# Patient Record
Sex: Male | Born: 1980 | Race: Black or African American | Hispanic: No | Marital: Single | Smoking: Current every day smoker
Health system: Southern US, Community
[De-identification: ages and names within clinical notes are randomized; demographics above are authoritative.]

## PROBLEM LIST (undated history)

## (undated) DIAGNOSIS — Z789 Other specified health status: Secondary | ICD-10-CM

## (undated) DIAGNOSIS — F419 Anxiety disorder, unspecified: Secondary | ICD-10-CM

## (undated) DIAGNOSIS — Z5189 Encounter for other specified aftercare: Secondary | ICD-10-CM

## (undated) DIAGNOSIS — L309 Dermatitis, unspecified: Secondary | ICD-10-CM

---
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
## 2002-06-10 ENCOUNTER — Encounter: Admission: RE | Admit: 2002-06-10 | Discharge: 2002-06-10 | Payer: Self-pay | Admitting: Family Medicine

## 2003-02-25 ENCOUNTER — Emergency Department (HOSPITAL_COMMUNITY): Admission: EM | Admit: 2003-02-25 | Discharge: 2003-02-25 | Payer: Self-pay | Admitting: Emergency Medicine

## 2006-03-06 ENCOUNTER — Ambulatory Visit: Payer: Self-pay | Admitting: *Deleted

## 2006-03-06 ENCOUNTER — Inpatient Hospital Stay (HOSPITAL_COMMUNITY): Admission: AD | Admit: 2006-03-06 | Discharge: 2006-03-10 | Payer: Self-pay | Admitting: *Deleted

## 2006-11-11 ENCOUNTER — Emergency Department (HOSPITAL_COMMUNITY): Admission: EM | Admit: 2006-11-11 | Discharge: 2006-11-12 | Payer: Self-pay | Admitting: Emergency Medicine

## 2007-12-06 ENCOUNTER — Emergency Department (HOSPITAL_COMMUNITY): Admission: EM | Admit: 2007-12-06 | Discharge: 2007-12-06 | Payer: Self-pay | Admitting: Emergency Medicine

## 2008-03-27 ENCOUNTER — Emergency Department (HOSPITAL_COMMUNITY): Admission: EM | Admit: 2008-03-27 | Discharge: 2008-03-27 | Payer: Self-pay | Admitting: Emergency Medicine

## 2008-07-17 ENCOUNTER — Emergency Department (HOSPITAL_COMMUNITY): Admission: EM | Admit: 2008-07-17 | Discharge: 2008-07-17 | Payer: Self-pay | Admitting: Emergency Medicine

## 2009-01-14 ENCOUNTER — Emergency Department (HOSPITAL_COMMUNITY): Admission: EM | Admit: 2009-01-14 | Discharge: 2009-01-14 | Payer: Self-pay | Admitting: Emergency Medicine

## 2009-05-02 ENCOUNTER — Emergency Department (HOSPITAL_COMMUNITY): Admission: EM | Admit: 2009-05-02 | Discharge: 2009-05-03 | Payer: Self-pay | Admitting: Emergency Medicine

## 2009-08-04 ENCOUNTER — Emergency Department (HOSPITAL_COMMUNITY): Admission: EM | Admit: 2009-08-04 | Discharge: 2009-08-04 | Payer: Self-pay | Admitting: Emergency Medicine

## 2010-04-12 ENCOUNTER — Emergency Department (HOSPITAL_COMMUNITY): Admission: EM | Admit: 2010-04-12 | Discharge: 2010-04-12 | Payer: Self-pay | Admitting: Emergency Medicine

## 2010-05-04 ENCOUNTER — Inpatient Hospital Stay (HOSPITAL_COMMUNITY): Admission: EM | Admit: 2010-05-04 | Discharge: 2010-05-10 | Payer: Self-pay | Admitting: Emergency Medicine

## 2010-05-06 ENCOUNTER — Encounter (INDEPENDENT_AMBULATORY_CARE_PROVIDER_SITE_OTHER): Payer: Self-pay | Admitting: Internal Medicine

## 2010-05-10 ENCOUNTER — Ambulatory Visit: Payer: Self-pay | Admitting: Psychiatry

## 2010-05-10 ENCOUNTER — Inpatient Hospital Stay (HOSPITAL_COMMUNITY): Admission: AD | Admit: 2010-05-10 | Discharge: 2010-05-11 | Payer: Self-pay | Admitting: Psychiatry

## 2010-07-26 ENCOUNTER — Ambulatory Visit: Payer: Self-pay | Admitting: Psychiatry

## 2010-09-02 ENCOUNTER — Emergency Department (HOSPITAL_COMMUNITY): Admission: EM | Admit: 2010-09-02 | Discharge: 2009-11-23 | Payer: Self-pay | Admitting: Internal Medicine

## 2010-12-10 LAB — COMPREHENSIVE METABOLIC PANEL
ALT: 33 U/L (ref 0–53)
AST: 27 U/L (ref 0–37)
Albumin: 3.3 g/dL — ABNORMAL LOW (ref 3.5–5.2)
Alkaline Phosphatase: 61 U/L (ref 39–117)
Alkaline Phosphatase: 62 U/L (ref 39–117)
Alkaline Phosphatase: 69 U/L (ref 39–117)
BUN: 9 mg/dL (ref 6–23)
CO2: 24 mEq/L (ref 19–32)
CO2: 24 mEq/L (ref 19–32)
Calcium: 8.2 mg/dL — ABNORMAL LOW (ref 8.4–10.5)
Calcium: 8.4 mg/dL (ref 8.4–10.5)
Calcium: 8.9 mg/dL (ref 8.4–10.5)
Chloride: 113 mEq/L — ABNORMAL HIGH (ref 96–112)
Creatinine, Ser: 0.91 mg/dL (ref 0.4–1.5)
Creatinine, Ser: 0.92 mg/dL (ref 0.4–1.5)
GFR calc non Af Amer: >60 mL/min (ref >60)
Glucose, Bld: 107 mg/dL — ABNORMAL HIGH (ref 70–99)
Potassium: 3.2 mEq/L — ABNORMAL LOW (ref 3.5–5.1)
Potassium: 3.7 mEq/L (ref 3.5–5.1)
Total Bilirubin: 0.9 mg/dL (ref 0.3–1.2)
Total Protein: 6 g/dL (ref 6.0–8.3)
Total Protein: 7.1 g/dL (ref 6.0–8.3)

## 2010-12-10 LAB — CBC
HCT: 38 % — ABNORMAL LOW (ref 39.0–52.0)
MCH: 23.3 pg — ABNORMAL LOW (ref 26.0–34.0)
MCH: 24 pg — ABNORMAL LOW (ref 26.0–34.0)
MCHC: 31.9 g/dL (ref 30.0–36.0)
MCHC: 32.2 g/dL (ref 30.0–36.0)
MCHC: 33 g/dL (ref 30.0–36.0)
MCV: 72.6 fL — ABNORMAL LOW (ref 78.0–100.0)
MCV: 72.7 fL — ABNORMAL LOW (ref 78.0–100.0)
MCV: 73.6 fL — ABNORMAL LOW (ref 78.0–100.0)
Platelets: 162 10*3/uL (ref 150–400)
Platelets: 175 10*3/uL (ref 150–400)
RBC: 5.05 MIL/uL (ref 4.22–5.81)
RBC: 5.17 MIL/uL (ref 4.22–5.81)
RBC: 5.34 MIL/uL (ref 4.22–5.81)
RDW: 14.1 % (ref 11.5–15.5)
RDW: 15.3 % (ref 11.5–15.5)
WBC: 4.4 10*3/uL (ref 4.0–10.5)
WBC: 4.6 10*3/uL (ref 4.0–10.5)
WBC: 4.7 10*3/uL (ref 4.0–10.5)

## 2010-12-10 LAB — RAPID URINE DRUG SCREEN, HOSP PERFORMED
Amphetamines: NOT DETECTED
Barbiturates: NOT DETECTED
Cocaine: NOT DETECTED

## 2010-12-10 LAB — DIFFERENTIAL
Eosinophils Relative: 0 % (ref 0–5)
Lymphocytes Relative: 32 % (ref 12–46)
Lymphs Abs: 1.5 10*3/uL (ref 0.7–4.0)
Monocytes Absolute: 0.4 10*3/uL (ref 0.1–1.0)
Monocytes Relative: 9 % (ref 3–12)
Neutrophils Relative %: 59 % (ref 43–77)

## 2010-12-10 LAB — BASIC METABOLIC PANEL
BUN: 8 mg/dL (ref 6–23)
Chloride: 110 mEq/L (ref 96–112)
Glucose, Bld: 96 mg/dL (ref 70–99)
Potassium: 3.7 mEq/L (ref 3.5–5.1)

## 2010-12-10 LAB — CARDIAC PANEL(CRET KIN+CKTOT+MB+TROPI)
CK, MB: 0.6 ng/mL (ref 0.3–4.0)
CK, MB: 0.7 ng/mL (ref 0.3–4.0)
CK, MB: 1.1 ng/mL (ref 0.3–4.0)
CK, MB: 1.1 ng/mL (ref 0.3–4.0)
Relative Index: 0.6 (ref 0.0–2.5)
Relative Index: 0.8 (ref 0.0–2.5)
Relative Index: 0.8 (ref 0.0–2.5)
Relative Index: 0.8 (ref 0.0–2.5)
Relative Index: INVALID (ref 0.0–2.5)
Total CK: 135 U/L (ref 7–232)
Total CK: 137 U/L (ref 7–232)
Total CK: 166 U/L (ref 7–232)
Troponin I: 0.01 ng/mL (ref 0.00–0.06)
Troponin I: 0.01 ng/mL (ref 0.00–0.06)
Troponin I: 0.01 ng/mL (ref 0.00–0.06)
Troponin I: 0.01 ng/mL (ref 0.00–0.06)
Troponin I: <0.01 ng/mL (ref 0.00–0.06)
Troponin I: <0.01 ng/mL (ref 0.00–0.06)

## 2010-12-10 LAB — GLUCOSE, CAPILLARY
Glucose-Capillary: 101 mg/dL — ABNORMAL HIGH (ref 70–99)
Glucose-Capillary: 103 mg/dL — ABNORMAL HIGH (ref 70–99)
Glucose-Capillary: 104 mg/dL — ABNORMAL HIGH (ref 70–99)
Glucose-Capillary: 115 mg/dL — ABNORMAL HIGH (ref 70–99)
Glucose-Capillary: 117 mg/dL — ABNORMAL HIGH (ref 70–99)
Glucose-Capillary: 74 mg/dL (ref 70–99)
Glucose-Capillary: 99 mg/dL (ref 70–99)

## 2010-12-10 LAB — PHOSPHORUS: Phosphorus: 4.4 mg/dL (ref 2.3–4.6)

## 2010-12-10 LAB — CK: Total CK: 211 U/L (ref 7–232)

## 2010-12-10 LAB — AMMONIA: Ammonia: 15 umol/L (ref 11–35)

## 2010-12-10 LAB — TSH: TSH: 1.123 u[IU]/mL (ref 0.350–4.500)

## 2010-12-10 LAB — ETHANOL: Alcohol, Ethyl (B): <5 mg/dL (ref 0–10)

## 2010-12-10 LAB — MAGNESIUM: Magnesium: 1.9 mg/dL (ref 1.5–2.5)

## 2010-12-29 LAB — ETHANOL: Alcohol, Ethyl (B): <5 mg/dL (ref 0–10)

## 2010-12-29 LAB — HEPATIC FUNCTION PANEL
ALT: 25 U/L (ref 0–53)
AST: 27 U/L (ref 0–37)
Albumin: 4.2 g/dL (ref 3.5–5.2)
Alkaline Phosphatase: 67 U/L (ref 39–117)
Total Protein: 7.6 g/dL (ref 6.0–8.3)

## 2010-12-29 LAB — CBC
Hemoglobin: 12.6 g/dL — ABNORMAL LOW (ref 13.0–17.0)
RBC: 5.3 MIL/uL (ref 4.22–5.81)

## 2010-12-29 LAB — POCT I-STAT, CHEM 8
HCT: 43 % (ref 39.0–52.0)
Hemoglobin: 14.6 g/dL (ref 13.0–17.0)
Potassium: 4.1 mEq/L (ref 3.5–5.1)
Sodium: 139 mEq/L (ref 135–145)

## 2010-12-29 LAB — ACETAMINOPHEN LEVEL: Acetaminophen (Tylenol), Serum: <10 ug/mL — ABNORMAL LOW (ref 10–30)

## 2010-12-29 LAB — RAPID URINE DRUG SCREEN, HOSP PERFORMED
Amphetamines: NOT DETECTED
Benzodiazepines: NOT DETECTED

## 2011-01-01 LAB — DIFFERENTIAL
Basophils Absolute: 0 10*3/uL (ref 0.0–0.1)
Lymphocytes Relative: 35 % (ref 12–46)
Monocytes Absolute: 0.6 10*3/uL (ref 0.1–1.0)
Monocytes Relative: 15 % — ABNORMAL HIGH (ref 3–12)
Neutro Abs: 1.9 10*3/uL (ref 1.7–7.7)

## 2011-01-01 LAB — URINALYSIS, ROUTINE W REFLEX MICROSCOPIC
Glucose, UA: NEGATIVE mg/dL
Ketones, ur: NEGATIVE mg/dL
Specific Gravity, Urine: 1.027 (ref 1.005–1.030)
pH: 6 (ref 5.0–8.0)

## 2011-01-01 LAB — POCT I-STAT, CHEM 8
BUN: 13 mg/dL (ref 6–23)
Calcium, Ion: 1.16 mmol/L (ref 1.12–1.32)
Chloride: 102 mEq/L (ref 96–112)
Potassium: 4 mEq/L (ref 3.5–5.1)

## 2011-01-01 LAB — CBC
Hemoglobin: 13.1 g/dL (ref 13.0–17.0)
RBC: 5.55 MIL/uL (ref 4.22–5.81)
RDW: 14.4 % (ref 11.5–15.5)

## 2011-01-05 LAB — POCT I-STAT, CHEM 8
BUN: 9 mg/dL (ref 6–23)
Chloride: 104 mEq/L (ref 96–112)
HCT: 44 % (ref 39.0–52.0)
Potassium: 4 mEq/L (ref 3.5–5.1)
Sodium: 140 mEq/L (ref 135–145)
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
## 2011-06-11 IMAGING — CR DG CHEST 1V PORT
1 series · 1 of 1 positions shown · non-contrast
Comparison: 01/14/2009.

CLINICAL DATA: Mid chest pain for 2 months.

PORTABLE CHEST - 1 VIEW

[series [date]]
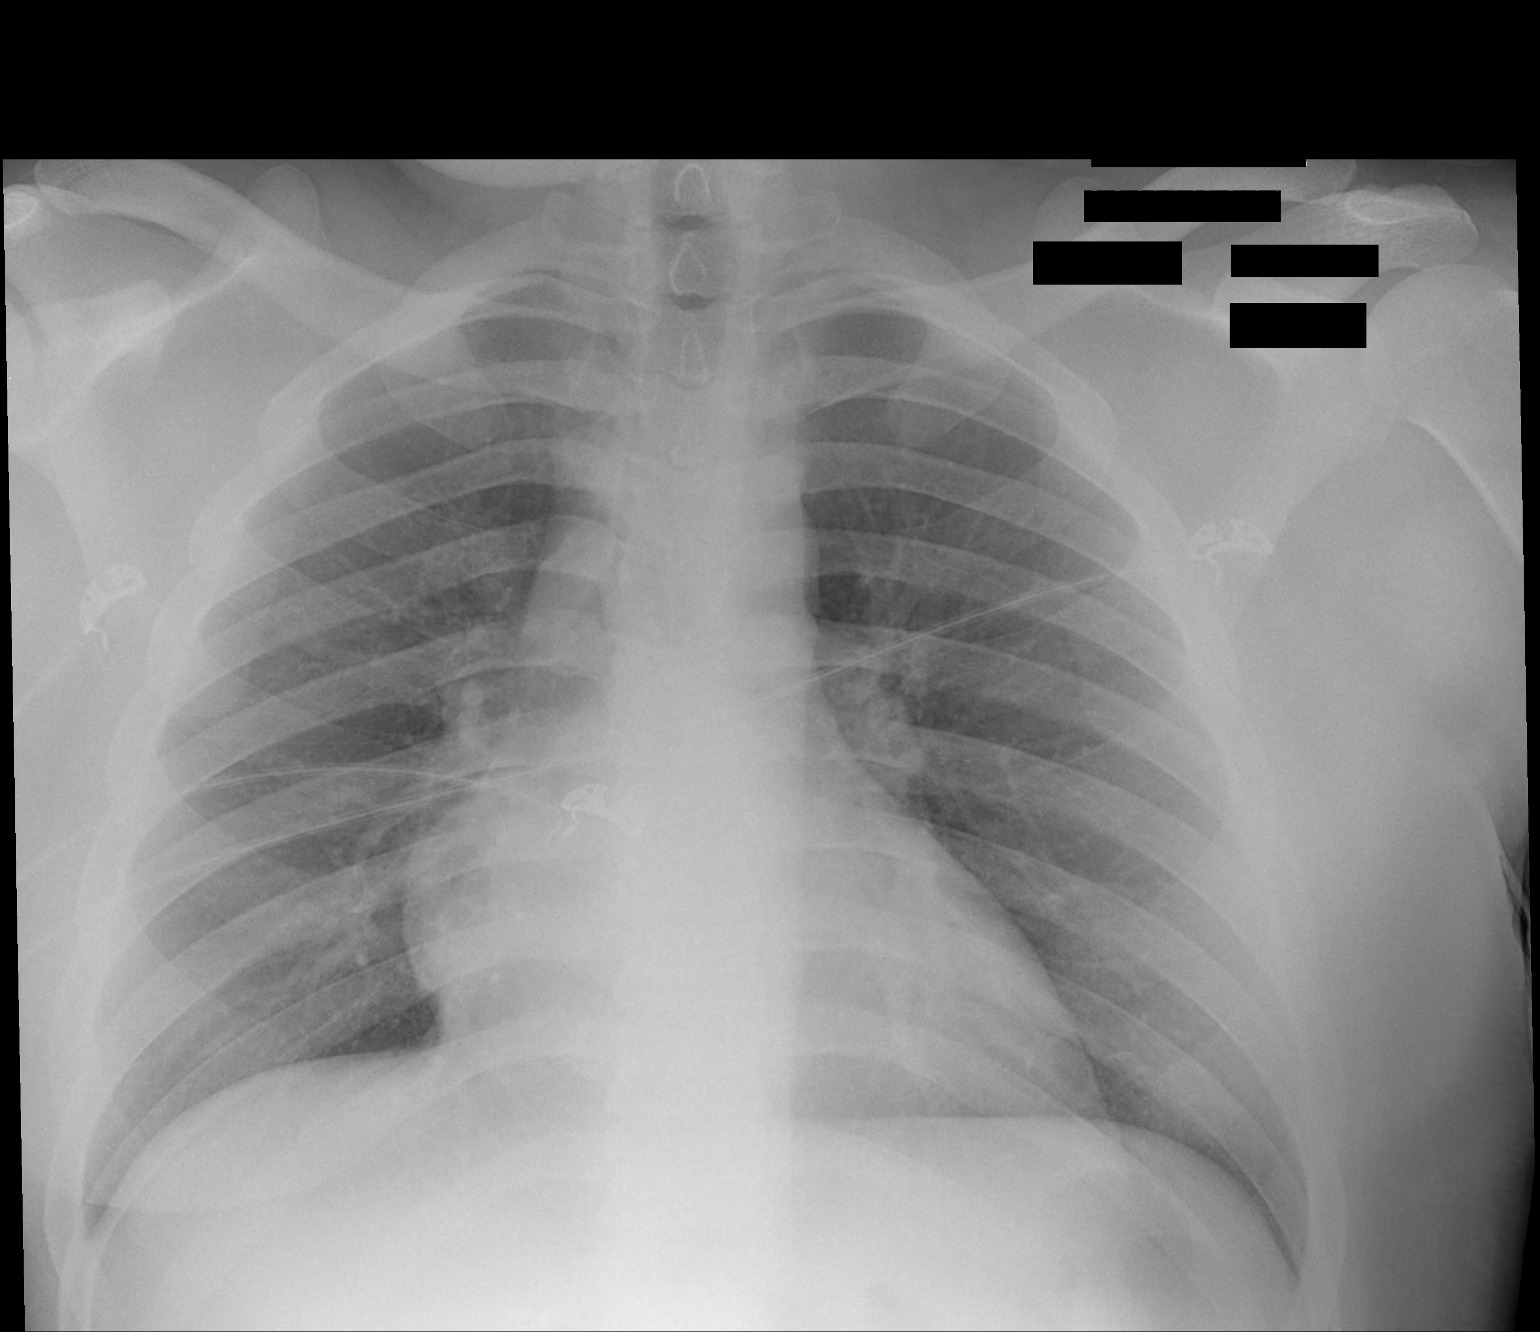

[1 of 1 positions shown; findings below may reference images not displayed]

FINDINGS: Heart size top normal. No infiltrate, congestive heart
failure or pneumothorax.  Minimal scoliosis thoracic spine.
IMPRESSION: Heart size top normal.

No infiltrate, congestive heart failure or pneumothorax.

This is a call report.

## 2011-06-27 LAB — CBC
HCT: 41.8
Hemoglobin: 13
MCV: 73.3 — ABNORMAL LOW
RBC: 5.7
WBC: 4.6

## 2011-06-27 LAB — DIFFERENTIAL
Eosinophils Absolute: 0
Eosinophils Relative: 1
Lymphocytes Relative: 45
Lymphs Abs: 2.1
Monocytes Relative: 9

## 2012-08-17 ENCOUNTER — Emergency Department (HOSPITAL_COMMUNITY): Payer: No Typology Code available for payment source

## 2012-08-17 ENCOUNTER — Encounter (HOSPITAL_COMMUNITY): Payer: Self-pay

## 2012-08-17 ENCOUNTER — Emergency Department (HOSPITAL_COMMUNITY)
Admission: EM | Admit: 2012-08-17 | Discharge: 2012-08-17 | Disposition: A | Discharge status: Home / Self Care | Payer: No Typology Code available for payment source | Attending: Emergency Medicine | Admitting: Emergency Medicine

## 2012-08-17 DIAGNOSIS — M542 Cervicalgia: Secondary | ICD-10-CM

## 2012-08-17 DIAGNOSIS — M545 Low back pain: Secondary | ICD-10-CM

## 2012-08-17 DIAGNOSIS — M549 Dorsalgia, unspecified: Secondary | ICD-10-CM

## 2012-08-17 DIAGNOSIS — R5381 Other malaise: Secondary | ICD-10-CM | POA: Insufficient documentation

## 2012-08-17 DIAGNOSIS — S0993XA Unspecified injury of face, initial encounter: Secondary | ICD-10-CM | POA: Insufficient documentation

## 2012-08-17 DIAGNOSIS — Y9389 Activity, other specified: Secondary | ICD-10-CM | POA: Insufficient documentation

## 2012-08-17 DIAGNOSIS — R209 Unspecified disturbances of skin sensation: Secondary | ICD-10-CM | POA: Insufficient documentation

## 2012-08-17 MED ORDER — IBUPROFEN 800 MG PO TABS: 800.0000 mg | ORAL_TABLET | Freq: Once | ORAL | Status: DC

## 2012-08-17 MED ORDER — NAPROXEN 500 MG PO TABS: 500.0000 mg | ORAL_TABLET | Freq: Two times a day (BID) | ORAL | Status: DC | PRN

## 2012-08-17 MED ORDER — METHOCARBAMOL 750 MG PO TABS: 750.0000 mg | ORAL_TABLET | Freq: Four times a day (QID) | ORAL | Status: DC | PRN

## 2012-08-17 MED ORDER — HYDROMORPHONE HCL PF 1 MG/ML IJ SOLN
1.0000 mg | Freq: Once | INTRAMUSCULAR | Status: AC
  Administered 2012-08-17: 1 mg via INTRAMUSCULAR
  Filled 2012-08-17: qty 1

## 2012-08-17 MED ORDER — HYDROCODONE-ACETAMINOPHEN 5-325 MG PO TABS: 1.0000 | ORAL_TABLET | Freq: Four times a day (QID) | ORAL | Status: DC | PRN

## 2012-08-17 MED ORDER — HYDROMORPHONE HCL PF 1 MG/ML IJ SOLN: 1.0000 mg | Freq: Once | INTRAMUSCULAR | Status: DC
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
## 2012-08-17 NOTE — ED Notes (Signed)
Care transferred and report given to Ben, RN.

## 2012-08-17 NOTE — ED Notes (Signed)
Patient returned from X-ray 
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
## 2012-08-17 NOTE — ED Provider Notes (Signed)
Medical screening examination/treatment/procedure(s) were conducted as a shared visit with non-physician practitioner(s) and myself.  I personally evaluated the patient during the encounter  Pt without any chest/abdominal tenderness He can range all extremities on my exam Imaging of head/neck/cspine ordered.  If negative and no focal neuro deficits noted will be amenable for d/c  Joya Gaskins, MD 08/17/12 [phone]

## 2012-08-17 NOTE — ED Notes (Signed)
Pt transported to radiology.
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
## 2012-08-20 NOTE — ED Provider Notes (Signed)
Medical screening examination/treatment/procedure(s) were conducted as a shared visit with non-physician practitioner(s) and myself.  I personally evaluated the patient during the encounter   Joya Gaskins, MD 08/20/12 [phone]

## 2017-02-17 ENCOUNTER — Encounter (HOSPITAL_COMMUNITY): Payer: Self-pay

## 2017-02-17 ENCOUNTER — Emergency Department (HOSPITAL_COMMUNITY): Payer: Self-pay

## 2017-02-17 ENCOUNTER — Emergency Department (HOSPITAL_COMMUNITY)
Admission: EM | Admit: 2017-02-17 | Discharge: 2017-02-18 | Disposition: A | Discharge status: Home / Self Care | Payer: Self-pay | Attending: Emergency Medicine | Admitting: Emergency Medicine

## 2017-02-17 DIAGNOSIS — R0789 Other chest pain: Secondary | ICD-10-CM | POA: Insufficient documentation

## 2017-02-17 DIAGNOSIS — A09 Infectious gastroenteritis and colitis, unspecified: Secondary | ICD-10-CM | POA: Insufficient documentation

## 2017-02-17 DIAGNOSIS — R197 Diarrhea, unspecified: Secondary | ICD-10-CM

## 2017-02-17 DIAGNOSIS — K2100 Gastro-esophageal reflux disease with esophagitis, without bleeding: Secondary | ICD-10-CM

## 2017-02-17 LAB — URINALYSIS, ROUTINE W REFLEX MICROSCOPIC
Bilirubin Urine: NEGATIVE
GLUCOSE, UA: NEGATIVE mg/dL
HGB URINE DIPSTICK: NEGATIVE
Ketones, ur: NEGATIVE mg/dL
LEUKOCYTES UA: NEGATIVE
Nitrite: NEGATIVE
Protein, ur: NEGATIVE mg/dL
SPECIFIC GRAVITY, URINE: 1.034 — AB (ref 1.005–1.030)
pH: 5 (ref 5.0–8.0)

## 2017-02-17 LAB — COMPREHENSIVE METABOLIC PANEL
ALT: 25 U/L (ref 17–63)
AST: 25 U/L (ref 15–41)
Albumin: 4.2 g/dL (ref 3.5–5.0)
Alkaline Phosphatase: 56 U/L (ref 38–126)
Anion gap: 6 (ref 5–15)
BUN: 13 mg/dL (ref 6–20)
CHLORIDE: 108 mmol/L (ref 101–111)
CO2: 25 mmol/L (ref 22–32)
CREATININE: 1.06 mg/dL (ref 0.61–1.24)
Calcium: 8.8 mg/dL — ABNORMAL LOW (ref 8.9–10.3)
Glucose, Bld: 127 mg/dL — ABNORMAL HIGH (ref 65–99)
Potassium: 3.7 mmol/L (ref 3.5–5.1)
SODIUM: 139 mmol/L (ref 135–145)
Total Bilirubin: 0.6 mg/dL (ref 0.3–1.2)
Total Protein: 7.3 g/dL (ref 6.5–8.1)

## 2017-02-17 LAB — CBC
HEMATOCRIT: 41.4 % (ref 39.0–52.0)
HEMOGLOBIN: 13.7 g/dL (ref 13.0–17.0)
MCH: 23.4 pg — AB (ref 26.0–34.0)
MCHC: 33.1 g/dL (ref 30.0–36.0)
MCV: 70.6 fL — AB (ref 78.0–100.0)
PLATELETS: 184 10*3/uL (ref 150–400)
RBC: 5.86 MIL/uL — AB (ref 4.22–5.81)
RDW: 15.2 % (ref 11.5–15.5)
WBC: 5.3 10*3/uL (ref 4.0–10.5)

## 2017-02-17 LAB — POCT I-STAT TROPONIN I: Troponin i, poc: 0 ng/mL (ref 0.00–0.08)

## 2017-02-17 LAB — LIPASE, BLOOD: Lipase: 23 U/L (ref 11–51)

## 2017-02-17 MED ORDER — SODIUM CHLORIDE 0.9 % IV BOLUS (SEPSIS)
1000.0000 mL | Freq: Once | INTRAVENOUS | Status: AC
  Administered 2017-02-17: 1000 mL via INTRAVENOUS

## 2017-02-17 MED ORDER — GI COCKTAIL ~~LOC~~
30.0000 mL | Freq: Once | ORAL | Status: AC
  Administered 2017-02-17: 30 mL via ORAL
  Filled 2017-02-17: qty 30

## 2017-02-17 MED ORDER — ONDANSETRON HCL 4 MG/2ML IJ SOLN
4.0000 mg | Freq: Once | INTRAMUSCULAR | Status: AC
  Administered 2017-02-17: 4 mg via INTRAVENOUS
  Filled 2017-02-17: qty 2

## 2017-02-17 MED ORDER — FAMOTIDINE 20 MG PO TABS
20.0000 mg | ORAL_TABLET | Freq: Two times a day (BID) | ORAL | 0 refills | Status: DC
Start: 2017-02-17 — End: 2019-10-30

## 2017-02-17 MED ORDER — ONDANSETRON HCL 4 MG PO TABS: 4.0000 mg | ORAL_TABLET | Freq: Four times a day (QID) | ORAL | 0 refills | Status: DC

## 2017-02-17 NOTE — ED Provider Notes (Signed)
[facility]-EMERGENCY DEPT Provider Note   CSN: [id number] Arrival date & time: 02/17/17  2011     History   Chief Complaint Chief Complaint  Patient presents with  . Dizziness  . Abdominal Pain  . Chest Pain    HPI Adam Lara is a 36 y.o. male.  The history is provided by the patient.  Dizziness  Quality:  Lightheadedness Severity:  Moderate Onset quality:  Gradual Duration:  4 days Timing:  Intermittent Progression:  Waxing and waning Chronicity:  New Context: bending over, physical activity and standing up   Relieved by:  Change in position Worsened by:  Nothing Associated symptoms: chest pain, diarrhea and nausea   Associated symptoms: no shortness of breath, no syncope and no vomiting   Associated symptoms comment:  Burning sensation that starts in upper abd and radiates to the chest after eating at first but now is all the time.  Worse with lying down.  Has not tried anything for this.  No fever.  Dry cough for several weeks but feels that is related to allergies and no different now. Risk factors: no multiple medications and no new medications   Risk factors comment:  No hx of NSAID or goody powder use Abdominal Pain   Associated symptoms include diarrhea and nausea. Pertinent negatives include vomiting.  Chest Pain   Associated symptoms include abdominal pain, dizziness and nausea. Pertinent negatives include no shortness of breath, no syncope and no vomiting.    History reviewed. No pertinent past medical history.  There are no active problems to display for this patient.   History reviewed. No pertinent surgical history.     Home Medications    Prior to Admission medications   Medication Sig Start Date End Date Taking? Authorizing Provider  acetaminophen (TYLENOL) 500 MG tablet Take 1,000 mg by mouth every 6 (six) hours as needed for mild pain, moderate pain or headache.    Yes [provider]    Family History History reviewed. No  pertinent family history.  Social History Social History  Substance Use Topics  . Smoking status: Not on file  . Smokeless tobacco: Not on file  . Alcohol use Not on file     Allergies   Patient has no known allergies.   Review of Systems Review of Systems  Respiratory: Negative for shortness of breath.   Cardiovascular: Positive for chest pain. Negative for syncope.  Gastrointestinal: Positive for abdominal pain, diarrhea and nausea. Negative for vomiting.  Neurological: Positive for dizziness.  All other systems reviewed and are negative.    Physical Exam Updated Vital Signs BP 129/77 (BP Location: Left Arm)   Pulse 71   Temp 99 F (37.2 C) (Oral)   Resp 18   Ht 5\' 11"  (1.803 m)   Wt 112.5 kg (248 lb)   SpO2 100%   BMI 34.59 kg/m   Physical Exam  Constitutional: He is oriented to person, place, and time. He appears well-developed and well-nourished. No distress.  HENT:  Head: Normocephalic and atraumatic.  Mouth/Throat: Oropharynx is clear and moist.  Eyes: Conjunctivae and EOM are normal. Pupils are equal, round, and reactive to light.  Neck: Normal range of motion. Neck supple.  Cardiovascular: Normal rate, regular rhythm and intact distal pulses.   No murmur heard. Pulmonary/Chest: Effort normal and breath sounds normal. No respiratory distress. He has no wheezes. He has no rales. He exhibits tenderness. He exhibits no crepitus and no swelling.    Abdominal: Soft. He exhibits no  distension. There is tenderness in the epigastric area. There is no rebound and no guarding.  Musculoskeletal: Normal range of motion. He exhibits no edema or tenderness.  Neurological: He is alert and oriented to person, place, and time.  Skin: Skin is warm and dry. No rash noted. No erythema.  Psychiatric: He has a normal mood and affect. His behavior is normal.  Nursing note and vitals reviewed.    ED Treatments / Results  Labs (all labs ordered are listed, but only  abnormal results are displayed) Labs Reviewed  COMPREHENSIVE METABOLIC PANEL - Abnormal; Notable for the following:       Result Value   Glucose, Bld 127 (*)    Calcium 8.8 (*)    All other components within normal limits  CBC - Abnormal; Notable for the following:    RBC 5.86 (*)    MCV 70.6 (*)    MCH 23.4 (*)    All other components within normal limits  URINALYSIS, ROUTINE W REFLEX MICROSCOPIC - Abnormal; Notable for the following:    Specific Gravity, Urine 1.034 (*)    All other components within normal limits  LIPASE, BLOOD  I-STAT TROPOININ, ED  POCT I-STAT TROPONIN I    EKG  EKG Interpretation  Date/Time:  Friday Feb 17 2017 20:17:59 EDT Ventricular Rate:  97 PR Interval:    QRS Duration: 87 QT Interval:  329 QTC Calculation: 418 R Axis:   68 Text Interpretation:  Sinus rhythm Biatrial enlargement Borderline T wave abnormalities Minimal ST elevation, anterior leads No significant change since last tracing Confirmed by Doug Sou [id number]) on 02/17/2017 9:13:12 PM       Radiology Dg Chest 2 View  Result Date: 02/17/2017 CLINICAL DATA:  Fatigue and dizziness with chest pain EXAM: CHEST  2 VIEW COMPARISON:  05/06/2010 FINDINGS: The heart size and mediastinal contours are within normal limits. Both lungs are clear. The visualized skeletal structures are unremarkable. IMPRESSION: No active cardiopulmonary disease. Electronically Signed   By: Alcide Clever M.D.   On: 02/17/2017 20:40    Procedures Procedures (including critical care time)  Medications Ordered in ED Medications  gi cocktail (Maalox,Lidocaine,Donnatal) (not administered)  ondansetron (ZOFRAN) injection 4 mg (not administered)  sodium chloride 0.9 % bolus 1,000 mL (not administered)     Initial Impression / Assessment and Plan / ED Course  I have reviewed the triage vital signs and the nursing notes.  Pertinent labs & imaging results that were available during my care of the patient were  reviewed by me and considered in my medical decision making (see chart for details).     Patient is a healthy 36 year old male presenting today with multiple symptoms most consistent of a viral etiology with associated dehydration. He also has symptoms classic for GERD. He is A burning sensation in his epigastrium going up into his chest, nausea and diarrhea. He has also been having lightheadedness.  No localized abdominal pain on exam concerning for appendicitis, diverticulitis, pancreatitis or cholecystitis. Lungs are clear and low suspicion for pneumothorax, dissection, PE or ACS. EKG, troponin, UA, CBC, CMP and lipase are all within normal limits. Chest x-ray within normal limits. Patient given IV fluid, Zofran and GI cocktail.  He denies any symptoms which would make him high risk for peptic ulcer disease. No NSAID use, Goody's powders, alcohol use. He does smoke cigarettes.  11:31 PM Pt feeling better after meds will d/c home.  Final Clinical Impressions(s) / ED Diagnoses   Final diagnoses:  Atypical  chest pain  Diarrhea of presumed infectious origin  Gastroesophageal reflux disease with esophagitis    New Prescriptions New Prescriptions   FAMOTIDINE (PEPCID) 20 MG TABLET    Take 1 tablet (20 mg total) by mouth 2 (two) times daily.   ONDANSETRON (ZOFRAN) 4 MG TABLET    Take 1 tablet (4 mg total) by mouth every 6 (six) hours.     Gwyneth Sprout, MD 02/17/17 [phone]

## 2017-02-17 NOTE — ED Notes (Signed)
Bed: WA03 Expected date:  Expected time:  Means of arrival:  Comments: 

## 2017-02-17 NOTE — ED Triage Notes (Signed)
Pt c/o fatgue, dizziness and burning in his stomach for the last 4 days that radiates to his L chest/epigastric area He states that today has been the most intense. Pt also states that he has vomited 4 times today. Diarrhea for the last 2 days. No fevers. A&Ox4. Ambulatory.

## 2017-02-17 NOTE — ED Notes (Signed)
Bed: [facility] Expected date:  Expected time:  Means of arrival:  Comments: 

## 2019-10-30 ENCOUNTER — Ambulatory Visit: Payer: 59 | Admitting: Nurse Practitioner

## 2019-10-30 ENCOUNTER — Other Ambulatory Visit: Payer: Self-pay

## 2019-10-30 ENCOUNTER — Encounter: Payer: Self-pay | Admitting: Nurse Practitioner

## 2019-10-30 VITALS — BP 122/80 | HR 77 | Temp 97.6°F | Ht 69.2 in | Wt 253.4 lb

## 2019-10-30 DIAGNOSIS — Z716 Tobacco abuse counseling: Secondary | ICD-10-CM

## 2019-10-30 DIAGNOSIS — Z23 Encounter for immunization: Secondary | ICD-10-CM

## 2019-10-30 DIAGNOSIS — M546 Pain in thoracic spine: Secondary | ICD-10-CM | POA: Diagnosis not present

## 2019-10-30 DIAGNOSIS — M25571 Pain in right ankle and joints of right foot: Secondary | ICD-10-CM

## 2019-10-30 DIAGNOSIS — M545 Low back pain: Secondary | ICD-10-CM | POA: Diagnosis not present

## 2019-10-30 DIAGNOSIS — Z13228 Encounter for screening for other metabolic disorders: Secondary | ICD-10-CM

## 2019-10-30 DIAGNOSIS — G8929 Other chronic pain: Secondary | ICD-10-CM

## 2019-10-30 DIAGNOSIS — Z720 Tobacco use: Secondary | ICD-10-CM

## 2019-10-30 DIAGNOSIS — J029 Acute pharyngitis, unspecified: Secondary | ICD-10-CM

## 2019-10-30 LAB — POCT RAPID STREP A (OFFICE): Rapid Strep A Screen: NEGATIVE

## 2019-10-30 MED ORDER — KETOROLAC TROMETHAMINE 60 MG/2ML IM SOLN
60.0000 mg | Freq: Once | INTRAMUSCULAR | Status: AC
  Administered 2019-10-30: 12:00:00 60 mg via INTRAMUSCULAR

## 2019-10-30 MED ORDER — TRIAMCINOLONE ACETONIDE 40 MG/ML IJ SUSP
40.0000 mg | Freq: Once | INTRAMUSCULAR | Status: AC
  Administered 2019-10-30: 12:00:00 40 mg via INTRAMUSCULAR

## 2019-10-30 MED ORDER — TETANUS-DIPHTH-ACELL PERTUSSIS 5-2.5-18.5 LF-MCG/0.5 IM SUSP
0.5000 mL | Freq: Once | INTRAMUSCULAR | Status: AC
  Administered 2019-10-30: 12:00:00 0.5 mL via INTRAMUSCULAR

## 2019-10-30 NOTE — Progress Notes (Signed)
This visit occurred during the SARS-CoV-2 public health emergency.  Safety protocols were in place, including screening questions prior to the visit, additional usage of staff PPE, and extensive cleaning of exam room while observing appropriate contact time as indicated for disinfecting solutions.  Subjective:     Patient ID: Adam Lara , male    DOB: [birth year] , 39 y.o.   MRN: [id number]   Chief Complaint  Patient presents with  . Establish Care  . Back Pain  . Ankle Pain  . Sore Throat    HPI  Here to establish care he found this office through [facility]. He has not seen a PCP in over 15 years.  He works in [employer].  Single.  6 children all healthy.    PMH - ? Blood transfusion as a child.  History of pneumonia as a child.   [facility] - Mother - hypertension, diabetes.  Father - unknown. 5 brothers and 1 sister all healthy.   Right foot injury as a child kicked in a window had a severe laceration, causing him problems such as swelling, pain, numbness to his foot.  Began bothering him more in the last 6 months.  Sometimes he is unable to get up in the morning, soaking the ankle/foot and warming up.  Having swelling to right ankle, worse in the morning. The first step is the worse.   Back Pain This is a chronic problem. The current episode started more than 1 month ago (worse 3 months ago, injury occurred as a child). The pain is present in the lumbar spine. The quality of the pain is described as aching (sometimes sharp). The symptoms are aggravated by twisting. Pertinent negatives include no abdominal pain, chest pain, fever or headaches. (Tightness to the back muscle.  ) Risk factors include obesity (fell out of a tree as a child, has been causing problems his entire life ). He has tried NSAIDs for the symptoms. The treatment provided mild relief.  Ankle Pain  The incident occurred more than 1 week ago.  Sore Throat  This is a new ([facility]. ) problem. The current episode started 1 to 4 weeks ago. The problem has been gradually worsening. There has been no fever. The pain is at a severity of 7/10. The pain is moderate. Associated symptoms include coughing, diarrhea and neck pain. Pertinent negatives include no abdominal pain, congestion, headaches or vomiting. He has tried gargles for the symptoms. The treatment provided no relief.     History reviewed. No pertinent past medical history.   Family History  Problem Relation Age of Onset  . Hypertension Mother   . Diabetes Mother   . Healthy Sister   . Healthy Sister   . Healthy Brother   . Healthy Brother   . Healthy Brother     No current outpatient medications on file.   No Known Allergies   Review of Systems  Constitutional: Negative for chills, fatigue and fever.  HENT: Positive for sore throat. Negative for congestion, sinus pressure and sinus pain.   Respiratory: Positive for cough.   Cardiovascular: Negative for chest pain, palpitations and leg swelling.  Gastrointestinal: Positive for diarrhea. Negative for abdominal distention, abdominal pain, nausea and vomiting.  Endocrine: Negative for polydipsia, polyphagia and polyuria.  Musculoskeletal: Positive for back pain and neck pain.  Neurological: Negative for headaches.  Hematological: Negative for adenopathy.  Psychiatric/Behavioral: Negative.      Today's Vitals  10/30/19 1019  BP: 122/80  Pulse: 77  Temp: 97.6 F (36.4 C)  TempSrc: Oral  SpO2: 97%  Weight: 253 lb 6.4 oz (114.9 kg)  Height: 5' 9.2" (1.758 m)  PainSc: 6   PainLoc: Back   Body mass index is 37.2 kg/m.   Objective:  Physical Exam Constitutional:      Appearance: He is well-developed.  Cardiovascular:     Rate and Rhythm: Normal rate and regular rhythm.  Skin:    Capillary Refill: Capillary refill takes less than 2 seconds.  Neurological:     Mental Status: He is alert.         Assessment And Plan:       1. Tobacco abuse    2. Tobacco abuse counseling Smoking cessation instruction/counseling given:  counseled patient on the dangers of tobacco use, advised patient to stop smoking, and reviewed strategies to maximize success  3. Encounter for immunization  Will give tetanus vaccine today while in office. Refer to order management. TDAP will be administered to adults 43-42 years old every 10 years. - Tdap (BOOSTRIX) injection 0.5 mL  4. Sore throat  Will check mono test he has already had covid test which was negative  Mild erythema to oropharynx - POCT rapid strep A - VITAMIN D 25 Hydroxy (Vit-D Deficiency, Fractures) - Mono, Qual W/Rflx if Negative - CBC with Differential/Platelet  5. Encounter for screening for metabolic disorder  - QRF75+OITG - Lipid panel - Hemoglobin A1c - TSH  6. Chronic bilateral low back pain without sciatica  He reports a long history of low back pain but has not had any intervention  Will check lumbar spine xray and administer toradol and kenalog in office. - DG Lumbar Spine Complete; Future - ketorolac (TORADOL) injection 60 mg - triamcinolone acetonide (KENALOG-40) injection 40 mg  7. Chronic bilateral thoracic back pain  Long history of back pain without intervention  Will consider physical therapy as necessary - ketorolac (TORADOL) injection 60 mg - triamcinolone acetonide (KENALOG-40) injection 40 mg  8. Chronic pain of right ankle  Likely related to degenerative changes will check xray to evaluate for structural damage - DG Ankle Complete Right; Future  Minette Brine, [name]    THE PATIENT IS ENCOURAGED TO PRACTICE SOCIAL DISTANCING DUE TO THE COVID-19 PANDEMIC.

## 2019-10-31 ENCOUNTER — Other Ambulatory Visit: Payer: Self-pay | Admitting: Nurse Practitioner

## 2019-10-31 ENCOUNTER — Ambulatory Visit
Admission: RE | Admit: 2019-10-31 | Discharge: 2019-10-31 | Disposition: A | Discharge status: Home / Self Care | Payer: PRIVATE HEALTH INSURANCE | Source: Ambulatory Visit | Attending: Nurse Practitioner | Admitting: Nurse Practitioner

## 2019-10-31 ENCOUNTER — Other Ambulatory Visit: Payer: Self-pay

## 2019-10-31 DIAGNOSIS — M25571 Pain in right ankle and joints of right foot: Secondary | ICD-10-CM

## 2019-10-31 DIAGNOSIS — G8929 Other chronic pain: Secondary | ICD-10-CM

## 2019-10-31 LAB — CMP14+EGFR
ALT: 17 IU/L (ref 0–44)
AST: 21 IU/L (ref 0–40)
Albumin/Globulin Ratio: 1.4 (ref 1.2–2.2)
Albumin: 4.4 g/dL (ref 4.0–5.0)
Alkaline Phosphatase: 91 IU/L (ref 39–117)
BUN/Creatinine Ratio: 14 (ref 9–20)
BUN: 12 mg/dL (ref 6–20)
Bilirubin Total: 0.9 mg/dL (ref 0.0–1.2)
CO2: 22 mmol/L (ref 20–29)
Calcium: 9.6 mg/dL (ref 8.7–10.2)
Chloride: 103 mmol/L (ref 96–106)
Creatinine, Ser: 0.83 mg/dL (ref 0.76–1.27)
GFR calc Af Amer: 131 mL/min/{1.73_m2} (ref >59)
GFR calc non Af Amer: 113 mL/min/{1.73_m2} (ref >59)
Globulin, Total: 3.1 g/dL (ref 1.5–4.5)
Glucose: 97 mg/dL (ref 65–99)
Potassium: 4.2 mmol/L (ref 3.5–5.2)
Sodium: 138 mmol/L (ref 134–144)
Total Protein: 7.5 g/dL (ref 6.0–8.5)

## 2019-10-31 LAB — CBC WITH DIFFERENTIAL/PLATELET
Basophils Absolute: 0 10*3/uL (ref 0.0–0.2)
Basos: 0 %
EOS (ABSOLUTE): 0.1 10*3/uL (ref 0.0–0.4)
Eos: 2 %
Hematocrit: 41.9 % (ref 37.5–51.0)
Hemoglobin: 13.6 g/dL (ref 13.0–17.7)
Immature Grans (Abs): 0 10*3/uL (ref 0.0–0.1)
Immature Granulocytes: 0 %
Lymphocytes Absolute: 2.3 10*3/uL (ref 0.7–3.1)
Lymphs: 41 %
MCH: 23.7 pg — ABNORMAL LOW (ref 26.6–33.0)
MCHC: 32.5 g/dL (ref 31.5–35.7)
MCV: 73 fL — ABNORMAL LOW (ref 79–97)
Monocytes Absolute: 0.5 10*3/uL (ref 0.1–0.9)
Monocytes: 10 %
Neutrophils Absolute: 2.6 10*3/uL (ref 1.4–7.0)
Neutrophils: 47 %
Platelets: 205 10*3/uL (ref 150–450)
RBC: 5.75 x10E6/uL (ref 4.14–5.80)
RDW: 15.3 % (ref 11.6–15.4)
WBC: 5.6 10*3/uL (ref 3.4–10.8)

## 2019-10-31 LAB — LIPID PANEL
Chol/HDL Ratio: 3.1 ratio (ref 0.0–5.0)
Cholesterol, Total: 137 mg/dL (ref 100–199)
HDL: 44 mg/dL (ref >39)
LDL Chol Calc (NIH): 78 mg/dL (ref 0–99)
Triglycerides: 74 mg/dL (ref 0–149)
VLDL Cholesterol Cal: 15 mg/dL (ref 5–40)

## 2019-10-31 LAB — HEMOGLOBIN A1C
Est. average glucose Bld gHb Est-mCnc: 120 mg/dL
Hgb A1c MFr Bld: 5.8 % — ABNORMAL HIGH (ref 4.8–5.6)

## 2019-10-31 LAB — VITAMIN D 25 HYDROXY (VIT D DEFICIENCY, FRACTURES): Vit D, 25-Hydroxy: 9.5 ng/mL — ABNORMAL LOW (ref 30.0–100.0)

## 2019-10-31 LAB — TSH: TSH: 0.746 u[IU]/mL (ref 0.450–4.500)

## 2019-10-31 MED ORDER — VITAMIN D (ERGOCALCIFEROL) 1.25 MG (50000 UNIT) PO CAPS: 50000.0000 [IU] | ORAL_CAPSULE | ORAL | 0 refills | Status: DC

## 2019-11-02 LAB — EPSTEIN-BARR VIRUS VCA ANTIBODY PANEL
EBV Early Antigen Ab, IgG: <9 U/mL (ref 0.0–8.9)
EBV NA IgG: >600 U/mL — ABNORMAL HIGH (ref 0.0–17.9)
EBV VCA IgG: 30.7 U/mL — ABNORMAL HIGH (ref 0.0–17.9)
EBV VCA IgM: <36 U/mL (ref 0.0–35.9)

## 2019-11-02 LAB — MONO, QUAL W/RFLX IF NEGATIVE: Mono Screen: NEGATIVE

## 2019-11-06 LAB — CMV ABS, IGG+IGM (CYTOMEGALOVIRUS)
CMV Ab - IgG: 3.3 U/mL — ABNORMAL HIGH (ref 0.00–0.59)
CMV IgM Ser EIA-aCnc: <30 AU/mL (ref 0.0–29.9)

## 2019-11-06 LAB — SPECIMEN STATUS REPORT

## 2019-11-27 ENCOUNTER — Ambulatory Visit: Payer: Self-pay | Admitting: Nurse Practitioner

## 2019-12-23 ENCOUNTER — Encounter: Payer: Self-pay | Admitting: Nurse Practitioner

## 2019-12-23 ENCOUNTER — Ambulatory Visit: Payer: 59 | Admitting: Nurse Practitioner

## 2019-12-23 ENCOUNTER — Ambulatory Visit: Payer: Self-pay | Admitting: Nurse Practitioner

## 2019-12-23 ENCOUNTER — Other Ambulatory Visit: Payer: Self-pay

## 2019-12-23 VITALS — BP 124/80 | HR 68 | Temp 97.6°F | Ht 69.2 in | Wt 240.4 lb

## 2019-12-23 DIAGNOSIS — Z113 Encounter for screening for infections with a predominantly sexual mode of transmission: Secondary | ICD-10-CM

## 2019-12-23 DIAGNOSIS — R799 Abnormal finding of blood chemistry, unspecified: Secondary | ICD-10-CM

## 2019-12-23 DIAGNOSIS — R7309 Other abnormal glucose: Secondary | ICD-10-CM | POA: Diagnosis not present

## 2019-12-23 DIAGNOSIS — M546 Pain in thoracic spine: Secondary | ICD-10-CM

## 2019-12-23 DIAGNOSIS — G8929 Other chronic pain: Secondary | ICD-10-CM

## 2019-12-23 DIAGNOSIS — E559 Vitamin D deficiency, unspecified: Secondary | ICD-10-CM

## 2019-12-23 DIAGNOSIS — R21 Rash and other nonspecific skin eruption: Secondary | ICD-10-CM

## 2019-12-23 MED ORDER — MOMETASONE FUROATE 0.1 % EX CREA: TOPICAL_CREAM | CUTANEOUS | 1 refills | Status: AC

## 2019-12-23 NOTE — Progress Notes (Signed)
This visit occurred during the SARS-CoV-2 public health emergency.  Safety protocols were in place, including screening questions prior to the visit, additional usage of staff PPE, and extensive cleaning of exam room while observing appropriate contact time as indicated for disinfecting solutions.  Subjective:     Patient ID: Adam Lara , male    DOB: 1981/09/11 , 39 y.o.   MRN: [id number]   Chief Complaint  Patient presents with  . Back Pain    f/u x-ray lab work f/u    HPI  He feels he is doing better with his back.   Still working without any issues.   He was last screened for HIV 3 years ago.      No past medical history on file.   Family History  Problem Relation Age of Onset  . Hypertension Mother   . Diabetes Mother   . Healthy Sister   . Healthy Sister   . Healthy Brother   . Healthy Brother   . Healthy Brother      Current Outpatient Medications:  Marland Kitchen  Vitamin D, Ergocalciferol, (DRISDOL) 1.25 MG (50000 UNIT) CAPS capsule, Take 1 capsule (50,000 Units total) by mouth 2 (two) times a week., Disp: 24 capsule, Rfl: 0   No Known Allergies   Review of Systems   Today's Vitals   12/23/19 1012  BP: 124/80  Pulse: 68  Temp: 97.6 F (36.4 C)  TempSrc: Oral  SpO2: 96%  Weight: 240 lb 6.4 oz (109 kg)  Height: 5' 9.2" (1.758 m)   Body mass index is 35.3 kg/m.   Objective:  Physical Exam Constitutional:      General: He is not in acute distress.    Appearance: Normal appearance.  Cardiovascular:     Rate and Rhythm: Normal rate and regular rhythm.     Pulses: Normal pulses.     Heart sounds: Normal heart sounds. No murmur.  Pulmonary:     Effort: Pulmonary effort is normal. No respiratory distress.     Breath sounds: Normal breath sounds.  Skin:    General: Skin is warm and dry.     Capillary Refill: Capillary refill takes less than 2 seconds.     Findings: Rash (slightly erythematous slightly raised rash to chest area near neck) present.   Neurological:     Mental Status: He is alert.         Assessment And Plan:     1. Abnormal glucose  Chronic, slightly elevated at last visit  Will recheck HgbA1c  Encouraged to increase physical activity - Hemoglobin A1c  2. Abnormal blood chemistry  Elevated levels with epstein Barr IgG  Will check HIV to rule out   He does not recall having exposure to EBV or HIV his last screening was 3 years ago  3. Screening examination for STD (sexually transmitted disease)  - HIV antibody (with reflex)  4. Rash and nonspecific skin eruption  Slightly erythematous and slightly raised rash to chest area  Encouraged to use sun screen when exposed to sun and will try mometasone cream   Advised not to use if going to be in the sun for long periods - mometasone (ELOCON) 0.1 % cream; Apply to affected area daily  Dispense: 45 g; Refill: 1  5. Vitamin D deficiency  Will check vitamin D level and supplement as needed.     Also encouraged to spend 15 minutes in the sun daily.  - VITAMIN D 25 Hydroxy (Vit-D Deficiency, Fractures)  6.  Chronic midline thoracic back pain  Discussed xray results with findings of degenerative disc disease.   Encouraged to stretch regularly and do low impact exercises and to continue with vitamin d    Minette Brine, [name]    THE PATIENT IS ENCOURAGED TO PRACTICE SOCIAL DISTANCING DUE TO THE COVID-19 PANDEMIC.

## 2019-12-24 LAB — HIV ANTIBODY (ROUTINE TESTING W REFLEX): HIV Screen 4th Generation wRfx: NONREACTIVE

## 2019-12-24 LAB — HEMOGLOBIN A1C
Est. average glucose Bld gHb Est-mCnc: 111 mg/dL
Hgb A1c MFr Bld: 5.5 % (ref 4.8–5.6)

## 2019-12-24 LAB — VITAMIN D 25 HYDROXY (VIT D DEFICIENCY, FRACTURES): Vit D, 25-Hydroxy: 63.7 ng/mL (ref 30.0–100.0)

## 2019-12-25 ENCOUNTER — Other Ambulatory Visit: Payer: Self-pay | Admitting: Nurse Practitioner

## 2019-12-25 MED ORDER — VITAMIN D (ERGOCALCIFEROL) 1.25 MG (50000 UNIT) PO CAPS: 50000.0000 [IU] | ORAL_CAPSULE | ORAL | 1 refills | Status: DC

## 2020-06-25 ENCOUNTER — Encounter: Payer: Self-pay | Admitting: Nurse Practitioner

## 2020-06-25 ENCOUNTER — Ambulatory Visit (INDEPENDENT_AMBULATORY_CARE_PROVIDER_SITE_OTHER): Payer: 59 | Admitting: Nurse Practitioner

## 2020-06-25 ENCOUNTER — Other Ambulatory Visit: Payer: Self-pay

## 2020-06-25 VITALS — BP 128/86 | HR 73 | Temp 97.7°F | Ht 69.2 in | Wt 218.2 lb

## 2020-06-25 DIAGNOSIS — E559 Vitamin D deficiency, unspecified: Secondary | ICD-10-CM

## 2020-06-25 DIAGNOSIS — Z716 Tobacco abuse counseling: Secondary | ICD-10-CM

## 2020-06-25 DIAGNOSIS — Z1159 Encounter for screening for other viral diseases: Secondary | ICD-10-CM

## 2020-06-25 DIAGNOSIS — B2700 Gammaherpesviral mononucleosis without complication: Secondary | ICD-10-CM

## 2020-06-25 DIAGNOSIS — R002 Palpitations: Secondary | ICD-10-CM | POA: Diagnosis not present

## 2020-06-25 DIAGNOSIS — R894 Abnormal immunological findings in specimens from other organs, systems and tissues: Secondary | ICD-10-CM | POA: Diagnosis not present

## 2020-06-25 DIAGNOSIS — R7309 Other abnormal glucose: Secondary | ICD-10-CM

## 2020-06-25 DIAGNOSIS — Z0000 Encounter for general adult medical examination without abnormal findings: Secondary | ICD-10-CM | POA: Diagnosis not present

## 2020-06-25 MED ORDER — NICOTINE 21 MG/24HR TD PT24: 21.0000 mg | MEDICATED_PATCH | Freq: Every day | TRANSDERMAL | 0 refills | Status: DC

## 2020-06-25 NOTE — Progress Notes (Signed)
I,Yamilka Roman [employer] as a Education administrator for [employer], [name].,have documented all relevant documentation on the behalf of Minette Brine, [name],as directed by  Minette Brine, [name] while in the presence of Minette Brine, [place]. This visit occurred during the SARS-CoV-2 public health emergency.  Safety protocols were in place, including screening questions prior to the visit, additional usage of staff PPE, and extensive cleaning of exam room while observing appropriate contact time as indicated for disinfecting solutions.  Subjective:     Patient ID: Adam Lara , male    DOB: Aug 19, 1981 , 39 y.o.   MRN: [id number]   Chief Complaint  Patient presents with  . Annual Exam    HPI   Patient here for HM.  Wt Readings from Last 3 Encounters: 06/25/20 : 218 lb 3.2 oz (99 kg) 12/23/19 : 240 lb 6.4 oz (109 kg) 10/30/19 : 253 lb 6.4 oz (114.9 kg)       History reviewed. No pertinent past medical history.   Family History  Problem Relation Age of Onset  . Hypertension Mother   . Diabetes Mother   . Healthy Sister   . Healthy Sister   . Healthy Brother   . Healthy Brother   . Healthy Brother      Current Outpatient Medications:  .  mometasone (ELOCON) 0.1 % cream, Apply to affected area daily, Disp: 45 g, Rfl: 1 .  nicotine (NICOTINE STEP 1) 21 mg/24hr patch, Place 1 patch (21 mg total) onto the skin daily., Disp: 28 patch, Rfl: 0 .  Vitamin D, Ergocalciferol, (DRISDOL) 1.25 MG (50000 UNIT) CAPS capsule, Take 1 capsule (50,000 Units total) by mouth every 7 (seven) days., Disp: 12 capsule, Rfl: 1   No Known Allergies   Men's preventive visit. Patient Health Questionnaire (PHQ-2) is    Office Visit from 10/30/2019 in [facility]  PHQ-2 Total Score 0     Patient is on a regular diet. Exercises minimally.  Marital status: Single. Relevant history for alcohol use is:   Social History   Substance and Sexual Activity  Alcohol Use Yes   Comment: OCCASIONALLY    Relevant history for tobacco use is:  Social History   Tobacco Use  Smoking Status Current Every Day Smoker  . Packs/day: 0.50  . Years: 20.00  . Pack years: 10.00  . Types: Cigarettes  Smokeless Tobacco Never Used  Tobacco Comment   he has cut down to 3-4 cigarettes a day.   .   Review of Systems  Constitutional: Negative.   HENT: Negative.   Eyes: Negative.   Respiratory: Negative.   Cardiovascular: Negative.   Gastrointestinal: Negative.   Endocrine: Negative.   Genitourinary: Negative.   Musculoskeletal: Negative for back pain.  Skin: Negative.   Neurological: Negative.   Hematological: Negative.   Psychiatric/Behavioral: Negative.      Today's Vitals   06/25/20 1122  BP: 128/86  Pulse: 73  Temp: 97.7 F (36.5 C)  TempSrc: Oral  Weight: 218 lb 3.2 oz (99 kg)  Height: 5' 9.2" (1.758 m)  PainSc: 0-No pain   Body mass index is 32.04 kg/m.   Objective:  Physical Exam Vitals reviewed.  Constitutional:      General: He is not in acute distress.    Appearance: Normal appearance. He is obese.  HENT:     Head: Normocephalic and atraumatic.     Right Ear: Tympanic membrane, ear canal and external ear normal. There is no impacted cerumen.     Left  Ear: Tympanic membrane, ear canal and external ear normal. There is no impacted cerumen.     Nose:     Comments: Deferred - masked    Mouth/Throat:     Comments: Deferred - masked Cardiovascular:     Rate and Rhythm: Normal rate and regular rhythm.     Pulses: Normal pulses.     Heart sounds: Normal heart sounds. No murmur heard.   Pulmonary:     Effort: Pulmonary effort is normal. No respiratory distress.     Breath sounds: Normal breath sounds. No wheezing.  Abdominal:     General: Abdomen is flat. Bowel sounds are normal. There is no distension.     Palpations: Abdomen is soft.  Genitourinary:    Prostate: Normal.     Rectum: Guaiac result negative.  Musculoskeletal:        General: Normal range of  motion.     Cervical back: Normal range of motion and neck supple.  Skin:    General: Skin is warm.     Capillary Refill: Capillary refill takes less than 2 seconds.  Neurological:     General: No focal deficit present.     Mental Status: He is alert and oriented to person, place, and time.  Psychiatric:        Mood and Affect: Mood normal.        Behavior: Behavior normal.        Thought Content: Thought content normal.        Judgment: Judgment normal.         Assessment And Plan:    1. Encounter for general adult medical examination w/o abnormal findings . Behavior modifications discussed and diet history reviewed.   . Pt will continue to exercise regularly and modify diet with low GI, plant based foods and decrease intake of processed foods.  . Recommend intake of daily multivitamin, Vitamin D, and calcium.  . Recommend for preventive screenings, as well as recommend immunizations that include influenza, TDAP - Lipid panel - CBC no Diff  2. Abnormal glucose  Chronic, stable  Encouraged to avoid sugary foods and drinks - Hemoglobin A1c  3. Vitamin D deficiency  Will check vitamin D level and supplement as needed.     Also encouraged to spend 15 minutes in the sun daily.  - VITAMIN D 25 Hydroxy (Vit-D Deficiency, Fractures)  4. Encounter for hepatitis C screening test for low risk patient  Will check Hepatitis C screening due to recent recommendations to screen all adults 18 years and older - Hepatitis C antibody  5. Intermittent palpitations  EKG reveals NSR HR 70  No irregular heart rate  This has been intermittent and will refer to cardiology for further evaluation - CMP14+EGFR - Ambulatory referral to Cardiology - TSH - EKG 12-Lead  6. 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   HIV screen was negative at last visit - CMV Antibody, IgM -  Cytomegalovirus Antibody, IgG - Epstein-Barr virus VCA, IgG  7. Tobacco abuse counseling Will try him on nicotine patches Smoking cessation instruction/counseling given:  counseled patient on the dangers of tobacco use, advised patient to stop smoking, and reviewed strategies to maximize success - nicotine (NICOTINE STEP 1) 21 mg/24hr patch; Place 1 patch (21 mg total) onto the skin daily.  Dispense: 28 patch; Refill: 0     Patient was given opportunity to ask questions.  Patient verbalized understanding of the plan and was able to repeat key elements of the plan. All questions were answered to their satisfaction.    Teola Bradley, [name], have reviewed all documentation for this visit. The documentation on 07/05/20 for the exam, diagnosis, procedures, and orders are all accurate and complete.  THE PATIENT IS ENCOURAGED TO PRACTICE SOCIAL DISTANCING DUE TO THE COVID-19 PANDEMIC.

## 2020-06-25 NOTE — Patient Instructions (Addendum)
COVID-19 Vaccine Information can be found at: [email or web address].uk For questions related to vaccine distribution or appointments, please email [email or web address] or call [phone].    Health Maintenance, Male Adopting a healthy lifestyle and getting preventive care are important in promoting health and wellness. Ask your health care provider about:  The right schedule for you to have regular tests and exams.  Things you can do on your own to prevent diseases and keep yourself healthy. What should I know about diet, weight, and exercise? Eat a healthy diet   Eat a diet that includes plenty of vegetables, fruits, low-fat dairy products, and lean protein.  Do not eat a lot of foods that are high in solid fats, added sugars, or sodium. Maintain a healthy weight Body mass index (BMI) is a measurement that can be used to identify possible weight problems. It estimates body fat based on height and weight. Your health care provider can help determine your BMI and help you achieve or maintain a healthy weight. Get regular exercise Get regular exercise. This is one of the most important things you can do for your health. Most adults should:  Exercise for at least 150 minutes each week. The exercise should increase your heart rate and make you sweat (moderate-intensity exercise).  Do strengthening exercises at least twice a week. This is in addition to the moderate-intensity exercise.  Spend less time sitting. Even light physical activity can be beneficial. Watch cholesterol and blood lipids Have your blood tested for lipids and cholesterol at 37 years of age, then have this test every 5 years. You may need to have your cholesterol levels checked more often if:  Your lipid or cholesterol levels are high.  You are older than 37 years of age.  You are at high risk for heart disease. What should I know about cancer screening? Many  types of cancers can be detected early and may often be prevented. Depending on your health history and family history, you may need to have cancer screening at various ages. This may include screening for:  Colorectal cancer.  Prostate cancer.  Skin cancer.  Lung cancer. What should I know about heart disease, diabetes, and high blood pressure? Blood pressure and heart disease  High blood pressure causes heart disease and increases the risk of stroke. This is more likely to develop in people who have high blood pressure readings, are of African descent, or are overweight.  Talk with your health care provider about your target blood pressure readings.  Have your blood pressure checked: ? Every 3-5 years if you are 27-90 years of age. ? Every year if you are 62 years old or older.  If you are between the ages of 21 and 93 and are a current or former smoker, ask your health care provider if you should have a one-time screening for abdominal aortic aneurysm (AAA). Diabetes Have regular diabetes screenings. This checks your fasting blood sugar level. Have the screening done:  Once every three years after age 32 if you are at a normal weight and have a low risk for diabetes.  More often and at a younger age if you are overweight or have a high risk for diabetes. What should I know about preventing infection? Hepatitis B If you have a higher risk for hepatitis B, you should be screened for this virus. Talk with your health care provider to find out if you are at risk for hepatitis B infection. Hepatitis C Blood testing is recommended for:  Everyone  born from [birth year] through 1963.  Anyone with known risk factors for hepatitis C. Sexually transmitted infections (STIs)  You should be screened each year for STIs, including gonorrhea and chlamydia, if: ? You are sexually active and are younger than 37 years of age. ? You are older than 37 years of age and your health care provider tells you  that you are at risk for this type of infection. ? Your sexual activity has changed since you were last screened, and you are at increased risk for chlamydia or gonorrhea. Ask your health care provider if you are at risk.  Ask your health care provider about whether you are at high risk for HIV. Your health care provider may recommend a prescription medicine to help prevent HIV infection. If you choose to take medicine to prevent HIV, you should first get tested for HIV. You should then be tested every 3 months for as long as you are taking the medicine. Follow these instructions at home: Lifestyle  Do not use any products that contain nicotine or tobacco, such as cigarettes, e-cigarettes, and chewing tobacco. If you need help quitting, ask your health care provider.  Do not use street drugs.  Do not share needles.  Ask your health care provider for help if you need support or information about quitting drugs. Alcohol use  Do not drink alcohol if your health care provider tells you not to drink.  If you drink alcohol: ? Limit how much you have to 0-2 drinks a day. ? Be aware of how much alcohol is in your drink. In the [place] one drink equals one 12 oz bottle of beer (355 mL), one 5 oz glass of wine (148 mL), or one 1 oz glass of hard liquor (44 mL). General instructions  Schedule regular health, dental, and eye exams.  Stay current with your vaccines.  Tell your health care provider if: ? You often feel depressed. ? You have ever been abused or do not feel safe at home. Summary  Adopting a healthy lifestyle and getting preventive care are important in promoting health and wellness.  Follow your health care provider's instructions about healthy diet, exercising, and getting tested or screened for diseases.  Follow your health care provider's instructions on monitoring your cholesterol and blood pressure. This information is not intended to replace advice given to you by your  health care provider. Make sure you discuss any questions you have with your health care provider. Document Revised: 09/05/2018 Document Reviewed: 09/05/2018 Elsevier Patient Education  2020 [employer] Inc.  COVID-19 Vaccine Information can be found at: PodExchange.nl For questions related to vaccine distribution or appointments, please email [email or web address] or call [phone].

## 2020-06-26 LAB — CMP14+EGFR
ALT: 14 IU/L (ref 0–44)
AST: 15 IU/L (ref 0–40)
Albumin/Globulin Ratio: 2 (ref 1.2–2.2)
Albumin: 4.4 g/dL (ref 4.0–5.0)
Alkaline Phosphatase: 64 IU/L (ref 44–121)
BUN/Creatinine Ratio: 16 (ref 9–20)
BUN: 14 mg/dL (ref 6–20)
Bilirubin Total: 0.4 mg/dL (ref 0.0–1.2)
CO2: 23 mmol/L (ref 20–29)
Calcium: 9.3 mg/dL (ref 8.7–10.2)
Chloride: 103 mmol/L (ref 96–106)
Creatinine, Ser: 0.88 mg/dL (ref 0.76–1.27)
GFR calc Af Amer: 128 mL/min/{1.73_m2} (ref >59)
GFR calc non Af Amer: 111 mL/min/{1.73_m2} (ref >59)
Globulin, Total: 2.2 g/dL (ref 1.5–4.5)
Glucose: 91 mg/dL (ref 65–99)
Potassium: 4.6 mmol/L (ref 3.5–5.2)
Sodium: 139 mmol/L (ref 134–144)
Total Protein: 6.6 g/dL (ref 6.0–8.5)

## 2020-06-26 LAB — LIPID PANEL
Chol/HDL Ratio: 3.3 ratio (ref 0.0–5.0)
Cholesterol, Total: 140 mg/dL (ref 100–199)
HDL: 42 mg/dL (ref >39)
LDL Chol Calc (NIH): 88 mg/dL (ref 0–99)
Triglycerides: 42 mg/dL (ref 0–149)
VLDL Cholesterol Cal: 10 mg/dL (ref 5–40)

## 2020-06-26 LAB — CBC
Hematocrit: 44.4 % (ref 37.5–51.0)
Hemoglobin: 13.5 g/dL (ref 13.0–17.7)
MCH: 23 pg — ABNORMAL LOW (ref 26.6–33.0)
MCHC: 30.4 g/dL — ABNORMAL LOW (ref 31.5–35.7)
MCV: 76 fL — ABNORMAL LOW (ref 79–97)
Platelets: 225 10*3/uL (ref 150–450)
RBC: 5.87 x10E6/uL — ABNORMAL HIGH (ref 4.14–5.80)
RDW: 14.3 % (ref 11.6–15.4)
WBC: 4.4 10*3/uL (ref 3.4–10.8)

## 2020-06-26 LAB — VITAMIN D 25 HYDROXY (VIT D DEFICIENCY, FRACTURES): Vit D, 25-Hydroxy: 21.8 ng/mL — ABNORMAL LOW (ref 30.0–100.0)

## 2020-06-26 LAB — EPSTEIN-BARR VIRUS VCA, IGG: EBV VCA IgG: 30.6 U/mL — ABNORMAL HIGH (ref 0.0–17.9)

## 2020-06-26 LAB — CMV IGM: CMV IgM Ser EIA-aCnc: <30 AU/mL (ref 0.0–29.9)

## 2020-06-26 LAB — CMV ANTIBODY, IGG (EIA): CMV Ab - IgG: 2.1 U/mL — ABNORMAL HIGH (ref 0.00–0.59)

## 2020-06-26 LAB — HEPATITIS C ANTIBODY: Hep C Virus Ab: <0.1 s/co ratio (ref 0.0–0.9)

## 2020-06-26 LAB — TSH: TSH: 1.07 u[IU]/mL (ref 0.450–4.500)

## 2020-06-26 LAB — HEMOGLOBIN A1C
Est. average glucose Bld gHb Est-mCnc: 114 mg/dL
Hgb A1c MFr Bld: 5.6 % (ref 4.8–5.6)

## 2020-06-28 ENCOUNTER — Other Ambulatory Visit: Payer: Self-pay | Admitting: Nurse Practitioner

## 2020-06-28 MED ORDER — VITAMIN D (ERGOCALCIFEROL) 1.25 MG (50000 UNIT) PO CAPS
50000.0000 [IU] | ORAL_CAPSULE | ORAL | 1 refills | Status: DC
Start: 2020-06-28 — End: 2021-02-09

## 2020-07-05 ENCOUNTER — Other Ambulatory Visit: Payer: Self-pay | Admitting: Nurse Practitioner

## 2020-07-05 DIAGNOSIS — B2700 Gammaherpesviral mononucleosis without complication: Secondary | ICD-10-CM

## 2020-07-05 DIAGNOSIS — R768 Other specified abnormal immunological findings in serum: Secondary | ICD-10-CM

## 2020-07-08 ENCOUNTER — Ambulatory Visit (INDEPENDENT_AMBULATORY_CARE_PROVIDER_SITE_OTHER): Payer: PRIVATE HEALTH INSURANCE | Admitting: Internal Medicine

## 2020-07-08 ENCOUNTER — Other Ambulatory Visit: Payer: Self-pay

## 2020-07-08 ENCOUNTER — Encounter: Payer: Self-pay | Admitting: Internal Medicine

## 2020-07-08 DIAGNOSIS — B2700 Gammaherpesviral mononucleosis without complication: Secondary | ICD-10-CM | POA: Diagnosis not present

## 2020-07-08 DIAGNOSIS — R768 Other specified abnormal immunological findings in serum: Secondary | ICD-10-CM

## 2020-07-08 DIAGNOSIS — B259 Cytomegaloviral disease, unspecified: Secondary | ICD-10-CM | POA: Insufficient documentation

## 2020-07-08 NOTE — Progress Notes (Signed)
[facility]  Reason for Consult: positive serology EBV and CMV Referring Provider:  Arnette Felts  HPI:  Adam Lara is a 39 y.o. male who presents to clinic for evaluation of EBV and CMV serology being positive.     He presented to his PCP in February 2021.  Complained of sore throat which has now resolved.  Labs that day = normal CMP, CBC with low MCV and normal hemoglobin, monospot negative.  Reflex EBV screen c/w prior infection (EBV VCA IgG pos, EBV NA IgG pos).  CMV IgG pos, IgM neg.  Strep test negative.  Seen in March 2021 for follow up --> HIV negative. Seen again in Sept for follow up --> HCV Ab neg, CMV IgM neg, CMV IgG pos, EBV VCA IgG Pos.  Feeling well today.  No acute complaints.  ROS as below.  Patient's Medications  New Prescriptions   No medications on file  Previous Medications   MOMETASONE (ELOCON) 0.1 % CREAM    Apply to affected area daily   NICOTINE (NICOTINE STEP 1) 21 MG/24HR PATCH    Place 1 patch (21 mg total) onto the skin daily.   VITAMIN D, ERGOCALCIFEROL, (DRISDOL) 1.25 MG (50000 UNIT) CAPS CAPSULE    Take 1 capsule (50,000 Units total) by mouth every 7 (seven) days.  Modified Medications   No medications on file  Discontinued Medications   No medications on file      PMHx: Vitamin D deficiency  Social History   Tobacco Use  . Smoking status: Current Every Day Smoker    Packs/day: 0.50    Years: 20.00    Pack years: 10.00    Types: Cigarettes  . Smokeless tobacco: Never Used  . Tobacco comment: he has cut down to 3-4 cigarettes a day.   Substance Use Topics  . Alcohol use: Yes    Comment: OCCASIONALLY  . Drug use: Yes    Types: Marijuana    Family History  Problem Relation Age of Onset  . Hypertension Mother   . Diabetes Mother   . Healthy Sister   . Healthy Sister   . Healthy Brother   . Healthy Brother   . Healthy Brother     No Known Allergies  Review of Systems: Review of Systems    Constitutional: Negative for chills and fever.  HENT: Negative for sore throat.   Musculoskeletal: Negative.     Objective: Vitals:   07/08/20 1432  BP: 133/86  Pulse: 82  SpO2: 97%  Weight: 219 lb (99.3 kg)     Body mass index is 32.15 kg/m.  Physical Exam Constitutional:      General: He is not in acute distress.    Appearance: Normal appearance.  HENT:     Head: Normocephalic and atraumatic.     Comments: Wearing mask.  Skin:    General: Skin is warm and dry.  Neurological:     General: No focal deficit present.     Mental Status: He is alert and oriented to person, place, and time.  Psychiatric:        Mood and Affect: Mood normal.        Behavior: Behavior normal.      Pertinent Labs and Microbiology: As detailed in HPI  CBC Latest Ref Rng & Units 06/25/2020 10/30/2019 02/17/2017  WBC 3.4 - 10.8 x10E3/uL 4.4 5.6 5.3  Hemoglobin 13.0 - 17.7 g/dL 61.9 50.9 32.6  Hematocrit 37.5 - 51.0 %  44.4 41.9 41.4  Platelets 150 - 450 x10E3/uL 225 205 184   CMP Latest Ref Rng & Units 06/25/2020 10/30/2019 02/17/2017  Glucose 65 - 99 mg/dL 91 97 818(H)  BUN 6 - 20 mg/dL 14 12 13   Creatinine 0.76 - 1.27 mg/dL 6.31 4.97  Sodium 134 - 144 mmol/L 139 138 139  Potassium 3.5 - 5.2 mmol/L 4.6 4.2 3.7  Chloride 96 - 106 mmol/L 103 103 108  CO2 20 - 29 mmol/L 23 22 25   Calcium 8.7 - 10.2 mg/dL 9.3 9.6 0.26)  Total Protein 6.0 - 8.5 g/dL 6.6 7.5 7.3  Total Bilirubin 0.0 - 1.2 mg/dL 0.4 0.9 0.6  Alkaline Phos 44 - 121 IU/L 64 91 56  AST 0 - 40 IU/L 15 21 25   ALT 0 - 44 IU/L 14 17 25        Assessment and Plan: 1. 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  2. Positive CMV IgG serology His labs show history of prior exposure and there is no further evaluation or work up needed.  Discussed CMV very common and that presence of positive IgG serology is not concerning.   Helpful to know in the future if he ever requires immunosuppression for prophylactic purposes.   Follow up as needed.   I spent greater than 45 minutes with the patient including greater than 50% of time in face to face counsel of the patient and in coordination of their care.       for Infectious Disease [facility] 07/08/2020, 2:36 PM

## 2020-07-08 NOTE — Patient Instructions (Signed)
It was nice meeting you today and talking about the lab work from your primary care doctor.  You can follow up with our clinic as needed.

## 2020-07-21 LAB — FERRITIN: Ferritin: 129 ng/mL (ref 30–400)

## 2020-07-21 LAB — IRON AND TIBC
Iron Saturation: 17 % (ref 15–55)
Iron: 63 ug/dL (ref 38–169)
Total Iron Binding Capacity: 366 ug/dL (ref 250–450)
UIBC: 303 ug/dL (ref 111–343)

## 2020-07-21 LAB — SPECIMEN STATUS REPORT

## 2020-08-04 ENCOUNTER — Ambulatory Visit: Payer: 59 | Admitting: Cardiovascular Disease

## 2020-08-04 NOTE — Progress Notes (Deleted)
Cardiology Office Note   Date:  08/04/2020   ID:  Oracio Galen, DOB 01-09-1981, MRN [id number]  PCP:  Arnette Felts, [name]  Cardiologist:   Chilton Si, MD   No chief complaint on file.     History of Present Illness: Adam Lara is a 39 y.o. male who is being seen today for the evaluation of palpitations at the request of Arnette Felts, [name].   Adam Lara saw Arnette Felts [id number]/20201 and reported intermittent palpitations.  EKG was unremarkable at that time.  Thyroid function was normal.  No past medical history on file.  No past surgical history on file.   Current Outpatient Medications  Medication Sig Dispense Refill  . mometasone (ELOCON) 0.1 % cream Apply to affected area daily 45 g 1  . nicotine (NICOTINE STEP 1) 21 mg/24hr patch Place 1 patch (21 mg total) onto the skin daily. 28 patch 0  . Vitamin D, Ergocalciferol, (DRISDOL) 1.25 MG (50000 UNIT) CAPS capsule Take 1 capsule (50,000 Units total) by mouth every 7 (seven) days. 12 capsule 1   No current facility-administered medications for this visit.    Allergies:   Patient has no known allergies.    Social History:  The patient  reports that he has been smoking cigarettes. He has a 10.00 pack-year smoking history. He has never used smokeless tobacco. He reports current alcohol use. He reports current drug use. Drug: Marijuana.   Family History:  The patient's ***family history includes Diabetes in his mother; Healthy in his brother, brother, brother, sister, and sister; Hypertension in his mother.    ROS:  Please see the history of present illness.   Otherwise, review of systems are positive for {NONE DEFAULTED:18576::"none"}.   All other systems are reviewed and negative.    PHYSICAL EXAM: VS:  There were no vitals taken for this visit. , BMI There is no height or weight on file to calculate BMI. GENERAL:  Well appearing HEENT:  Pupils equal round and reactive, fundi not visualized, oral mucosa  unremarkable NECK:  No jugular venous distention, waveform within normal limits, carotid upstroke brisk and symmetric, no bruits, no thyromegaly LYMPHATICS:  No cervical adenopathy LUNGS:  Clear to auscultation bilaterally HEART:  RRR.  PMI not displaced or sustained,S1 and S2 within normal limits, no S3, no S4, no clicks, no rubs, *** murmurs ABD:  Flat, positive bowel sounds normal in frequency in pitch, no bruits, no rebound, no guarding, no midline pulsatile mass, no hepatomegaly, no splenomegaly EXT:  2 plus pulses throughout, no edema, no cyanosis no clubbing SKIN:  No rashes no nodules NEURO:  Cranial nerves II through XII grossly intact, motor grossly intact throughout PSYCH:  Cognitively intact, oriented to person place and time    EKG:  EKG {ACTION; IS/IS WGN:[id number]} ordered today. The ekg ordered today demonstrates ***   Recent Labs: 06/25/2020: ALT 14; BUN 14; Creatinine, Ser 0.88; Hemoglobin 13.5; Platelets 225; Potassium 4.6; Sodium 139; TSH 1.070    Lipid Panel    Component Value Date/Time   CHOL 140 06/25/2020 1230   TRIG 42 06/25/2020 1230   HDL 42 06/25/2020 1230   CHOLHDL 3.3 06/25/2020 1230   LDLCALC 88 06/25/2020 1230      Wt Readings from Last 3 Encounters:  07/08/20 219 lb (99.3 kg)  06/25/20 218 lb 3.2 oz (99 kg)  12/23/19 240 lb 6.4 oz (109 kg)      ASSESSMENT AND PLAN:  ***   Current medicines are reviewed at  length with the patient today.  The patient {ACTIONS; HAS/DOES NOT HAVE:19233} concerns regarding medicines.  The following changes have been made:  {PLAN; NO CHANGE:13088:s}  Labs/ tests ordered today include: *** No orders of the defined types were placed in this encounter.    Disposition:   FU with ***     Signed, Donalee Gaumond C. Duke Salvia, MD, [facility]  08/04/2020 12:59 PM    [facility]

## 2020-10-04 ENCOUNTER — Other Ambulatory Visit: Payer: Self-pay

## 2020-10-04 ENCOUNTER — Other Ambulatory Visit: Payer: 59

## 2020-10-04 DIAGNOSIS — Z20822 Contact with and (suspected) exposure to covid-19: Secondary | ICD-10-CM

## 2020-10-06 ENCOUNTER — Ambulatory Visit: Payer: 59 | Admitting: Cardiovascular Disease

## 2020-10-06 LAB — NOVEL CORONAVIRUS, NAA: SARS-CoV-2, NAA: NOT DETECTED

## 2020-10-06 LAB — SARS-COV-2, NAA 2 DAY TAT

## 2020-10-08 ENCOUNTER — Telehealth (INDEPENDENT_AMBULATORY_CARE_PROVIDER_SITE_OTHER): Payer: 59 | Admitting: Nurse Practitioner

## 2020-10-08 ENCOUNTER — Other Ambulatory Visit: Payer: Self-pay

## 2020-10-08 ENCOUNTER — Other Ambulatory Visit: Payer: 59

## 2020-10-08 DIAGNOSIS — R059 Cough, unspecified: Secondary | ICD-10-CM | POA: Diagnosis not present

## 2020-10-08 DIAGNOSIS — R509 Fever, unspecified: Secondary | ICD-10-CM

## 2020-10-08 MED ORDER — ALBUTEROL SULFATE HFA 108 (90 BASE) MCG/ACT IN AERS: 2.0000 | INHALATION_SPRAY | Freq: Four times a day (QID) | RESPIRATORY_TRACT | 2 refills | Status: DC | PRN

## 2020-10-08 MED ORDER — AZITHROMYCIN 250 MG PO TABS: ORAL_TABLET | ORAL | 0 refills | Status: AC

## 2020-10-08 NOTE — Progress Notes (Signed)
Virtual Visit via MyChart   This visit type was conducted due to national recommendations for restrictions regarding the COVID-19 Pandemic (e.g. social distancing) in an effort to limit this patient's exposure and mitigate transmission in our community.  Due to his co-morbid illnesses, this patient is at least at moderate risk for complications without adequate follow up.  This format is felt to be most appropriate for this patient at this time.  All issues noted in this document were discussed and addressed.  A limited physical exam was performed with this format.    This visit type was conducted due to national recommendations for restrictions regarding the COVID-19 Pandemic (e.g. social distancing) in an effort to limit this patient's exposure and mitigate transmission in our community.  Patients identity confirmed using two different identifiers.  This format is felt to be most appropriate for this patient at this time.  All issues noted in this document were discussed and addressed.  No physical exam was performed (except for noted visual exam findings with Video Visits).    Date:  10/13/2020   ID:  Adam Lara, DOB 29-Jul-1981, MRN [id number]  Patient Location:  Sitting in the car - spoke with Adam Lara  Provider location:   Office    Chief Complaint:  Possible covid, cough  History of Present Illness:    Adam Lara is a 40 y.o. male who presents via video conferencing for a telehealth visit today.    The patient does have symptoms concerning for COVID-19 infection (fever, chills, cough, or new shortness of breath).   Symptoms started 2 days ago.  He has been tested last Saturday. Fever of 101.3, chills, fatigue and nauseated. He is having difficulty with breathing. dayquil for symptoms. He has body aches to his legs, thighs, and chest area.  Slight headache. Dry cough.  He has not been vaccinated.      History reviewed. No pertinent past medical history. History  reviewed. No pertinent surgical history.   Current Meds  Medication Sig  . albuterol (VENTOLIN HFA) 108 (90 Base) MCG/ACT inhaler Inhale 2 puffs into the lungs every 6 (six) hours as needed for wheezing or shortness of breath.  Marland Kitchen azithromycin (ZITHROMAX) 250 MG tablet Take 2 tablets (500 mg) on  Day 1,  followed by 1 tablet (250 mg) once daily on Days 2 through 5.  . mometasone (ELOCON) 0.1 % cream Apply to affected area daily  . Vitamin D, Ergocalciferol, (DRISDOL) 1.25 MG (50000 UNIT) CAPS capsule Take 1 capsule (50,000 Units total) by mouth every 7 (seven) days.  . [DISCONTINUED] nicotine (NICOTINE STEP 1) 21 mg/24hr patch Place 1 patch (21 mg total) onto the skin daily.     Allergies:   Patient has no known allergies.   Social History   Tobacco Use  . Smoking status: Current Every Day Smoker    Packs/day: 0.50    Years: 20.00    Pack years: 10.00    Types: Cigarettes  . Smokeless tobacco: Never Used  . Tobacco comment: he has cut down to 3-4 cigarettes a day.   Substance Use Topics  . Alcohol use: Yes    Comment: OCCASIONALLY  . Drug use: Yes    Types: Marijuana     Family Hx: The patient's family history includes Diabetes in his mother; Healthy in his brother, brother, brother, sister, and sister; Hypertension in his mother.  ROS:   Please see the history of present illness.    Review of Systems  Constitutional:  Positive for chills, fever and malaise/fatigue.  Respiratory: Positive for cough. Negative for shortness of breath (with exertion) and wheezing.   Cardiovascular: Negative.   Neurological: Negative for dizziness and tingling.  Psychiatric/Behavioral: Negative.     All other systems reviewed and are negative.   Labs/Other Tests and Data Reviewed:    Recent Labs: 06/25/2020: ALT 14; TSH 1.070 10/10/2020: BUN 14; Creatinine, Ser 1.09; Hemoglobin 14.3; Platelets 168; Potassium 4.5; Sodium 137   Recent Lipid Panel Lab Results  Component Value Date/Time    CHOL 140 06/25/2020 12:30 PM   TRIG 42 06/25/2020 12:30 PM   HDL 42 06/25/2020 12:30 PM   CHOLHDL 3.3 06/25/2020 12:30 PM   LDLCALC 88 06/25/2020 12:30 PM    Wt Readings from Last 3 Encounters:  07/08/20 219 lb (99.3 kg)  06/25/20 218 lb 3.2 oz (99 kg)  12/23/19 240 lb 6.4 oz (109 kg)     Exam:    Vital Signs:  There were no vitals taken for this visit.    Physical Exam Vitals reviewed.  Constitutional:      General: He is not in acute distress.    Appearance: Normal appearance.  Pulmonary:     Effort: Pulmonary effort is normal. No respiratory distress.  Neurological:     General: No focal deficit present.     Mental Status: He is alert and oriented to person, place, and time.     Cranial Nerves: No cranial nerve deficit.  Psychiatric:        Mood and Affect: Mood normal.        Behavior: Behavior normal.        Thought Content: Thought content normal.        Judgment: Judgment normal.     ASSESSMENT & PLAN:    1. Cough  Will have him to come to the office to get tested for covid outside, advised him to call the office and extension for a MA to come get a nasal swab  Will treat with antibiotics due to previous history (reported) of pneumonia  Also provided ventolin inhaler he is advised if shortness of breath worsens to go to ER or chest pain - Novel Coronavirus, NAA ([facility]) - albuterol (VENTOLIN HFA) 108 (90 Base) MCG/ACT inhaler; Inhale 2 puffs into the lungs every 6 (six) hours as needed for wheezing or shortness of breath.  Dispense: 8 g; Refill: 2 - azithromycin (ZITHROMAX) 250 MG tablet; Take 2 tablets (500 mg) on  Day 1,  followed by 1 tablet (250 mg) once daily on Days 2 through 5.  Dispense: 6 each; Refill: 0  2. Fever, unspecified fever cause He is advised to drink fluids and take tylenol for fever, rest - Novel Coronavirus, NAA ([facility]) - azithromycin (ZITHROMAX) 250 MG tablet; Take 2 tablets (500 mg) on  Day 1,  followed by 1 tablet (250 mg) once  daily on Days 2 through 5.  Dispense: 6 each; Refill: 0   COVID-19 Education: The signs and symptoms of COVID-19 were discussed with the patient and how to seek care for testing (follow up with PCP or arrange E-visit).  The importance of social distancing was discussed today.  Patient Risk:   After full review of this patients clinical status, I feel that they are at least moderate risk at this time.  Time:   Today, I have spent 11 minutes/ seconds with the patient with telehealth technology discussing above diagnoses.     Medication Adjustments/Labs and Tests Ordered: Current medicines  are reviewed at length with the patient today.  Concerns regarding medicines are outlined above.   Tests Ordered: Orders Placed This Encounter  Procedures  . Novel Coronavirus, NAA ([facility])  . SARS-COV-2, NAA 2 DAY TAT    Medication Changes: Meds ordered this encounter  Medications  . albuterol (VENTOLIN HFA) 108 (90 Base) MCG/ACT inhaler    Sig: Inhale 2 puffs into the lungs every 6 (six) hours as needed for wheezing or shortness of breath.    Dispense:  8 g    Refill:  2  . azithromycin (ZITHROMAX) 250 MG tablet    Sig: Take 2 tablets (500 mg) on  Day 1,  followed by 1 tablet (250 mg) once daily on Days 2 through 5.    Dispense:  6 each    Refill:  0    Disposition:  Follow up prn  Signed, Arnette Felts, [name]

## 2020-10-09 ENCOUNTER — Other Ambulatory Visit: Payer: Self-pay | Admitting: Nurse Practitioner

## 2020-10-09 ENCOUNTER — Emergency Department (HOSPITAL_COMMUNITY)
Admission: EM | Admit: 2020-10-09 | Discharge: 2020-10-10 | Disposition: A | Discharge status: Home / Self Care | Payer: 59 | Attending: Emergency Medicine | Admitting: Emergency Medicine

## 2020-10-09 ENCOUNTER — Other Ambulatory Visit: Payer: Self-pay

## 2020-10-09 DIAGNOSIS — R509 Fever, unspecified: Secondary | ICD-10-CM | POA: Diagnosis not present

## 2020-10-09 DIAGNOSIS — R531 Weakness: Secondary | ICD-10-CM | POA: Insufficient documentation

## 2020-10-09 DIAGNOSIS — R0602 Shortness of breath: Secondary | ICD-10-CM | POA: Diagnosis present

## 2020-10-09 DIAGNOSIS — Z5321 Procedure and treatment not carried out due to patient leaving prior to being seen by health care provider: Secondary | ICD-10-CM | POA: Diagnosis not present

## 2020-10-09 DIAGNOSIS — Z716 Tobacco abuse counseling: Secondary | ICD-10-CM

## 2020-10-09 DIAGNOSIS — R109 Unspecified abdominal pain: Secondary | ICD-10-CM | POA: Diagnosis not present

## 2020-10-10 ENCOUNTER — Encounter (HOSPITAL_COMMUNITY): Payer: Self-pay | Admitting: *Deleted

## 2020-10-10 ENCOUNTER — Other Ambulatory Visit: Payer: Self-pay

## 2020-10-10 ENCOUNTER — Emergency Department (HOSPITAL_COMMUNITY): Payer: 59

## 2020-10-10 ENCOUNTER — Other Ambulatory Visit: Payer: 59

## 2020-10-10 LAB — NOVEL CORONAVIRUS, NAA: SARS-CoV-2, NAA: DETECTED — AB

## 2020-10-10 LAB — BASIC METABOLIC PANEL
Anion gap: 9 (ref 5–15)
BUN: 14 mg/dL (ref 6–20)
CO2: 24 mmol/L (ref 22–32)
Calcium: 9.6 mg/dL (ref 8.9–10.3)
Chloride: 104 mmol/L (ref 98–111)
Creatinine, Ser: 1.09 mg/dL (ref 0.61–1.24)
GFR, Estimated: >60 mL/min (ref >60)
Glucose, Bld: 112 mg/dL — ABNORMAL HIGH (ref 70–99)
Potassium: 4.5 mmol/L (ref 3.5–5.1)
Sodium: 137 mmol/L (ref 135–145)

## 2020-10-10 LAB — CBC
HCT: 43.9 % (ref 39.0–52.0)
Hemoglobin: 14.3 g/dL (ref 13.0–17.0)
MCH: 23.3 pg — ABNORMAL LOW (ref 26.0–34.0)
MCHC: 32.6 g/dL (ref 30.0–36.0)
MCV: 71.4 fL — ABNORMAL LOW (ref 80.0–100.0)
Platelets: 168 10*3/uL (ref 150–400)
RBC: 6.15 MIL/uL — ABNORMAL HIGH (ref 4.22–5.81)
RDW: 17.2 % — ABNORMAL HIGH (ref 11.5–15.5)
WBC: 3.8 10*3/uL — ABNORMAL LOW (ref 4.0–10.5)
nRBC: 0 % (ref 0.0–0.2)

## 2020-10-10 LAB — SARS-COV-2, NAA 2 DAY TAT

## 2020-10-10 NOTE — ED Notes (Signed)
Pt called for vitals recheck multiple times with no response.

## 2020-10-10 NOTE — ED Triage Notes (Signed)
Pt GF has covid, had negative test on Sunday, took additional test, has not yet rec'd results yet. He c/o SOB, weakness, pain in left flank area since Wednesday. Intermittent fevers. No acute distress, clear/dry mucous cough.

## 2020-10-13 ENCOUNTER — Telehealth: Payer: 59 | Admitting: Nurse Practitioner

## 2020-10-13 ENCOUNTER — Encounter: Payer: Self-pay | Admitting: Nurse Practitioner

## 2020-10-13 ENCOUNTER — Telehealth: Payer: Self-pay

## 2020-10-13 NOTE — Telephone Encounter (Signed)
Attempted to call pt to see how he is doing since being diagnosed with covid unable to leave vm YL,RMA

## 2020-10-20 ENCOUNTER — Ambulatory Visit: Payer: 59 | Admitting: Cardiovascular Disease

## 2020-10-20 NOTE — Progress Notes (Deleted)
Cardiology Office Note   Date:  10/20/2020   ID:  Adam Lara, DOB September 29, 1980, MRN [id number]  PCP:  Adam Felts, [name]  Cardiologist:   Adam Si, MD   No chief complaint on file.     History of Present Illness: Adam Lara is a 40 y.o. male with recent COVID-19 infection who is being seen today for the evaluation of palpitations at the request of Adam Felts, [name].  Mr. Adam Lara had COVID-19     No past medical history on file.  No past surgical history on file.   Current Outpatient Medications  Medication Sig Dispense Refill  . albuterol (VENTOLIN HFA) 108 (90 Base) MCG/ACT inhaler Inhale 2 puffs into the lungs every 6 (six) hours as needed for wheezing or shortness of breath. 8 g 2  . mometasone (ELOCON) 0.1 % cream Apply to affected area daily 45 g 1  . nicotine (NICODERM CQ - DOSED IN MG/24 HOURS) 21 mg/24hr patch APPLY 1 PATCH(21 MG) TOPICALLY TO THE SKIN DAILY 28 patch 0  . Vitamin D, Ergocalciferol, (DRISDOL) 1.25 MG (50000 UNIT) CAPS capsule Take 1 capsule (50,000 Units total) by mouth every 7 (seven) days. 12 capsule 1   No current facility-administered medications for this visit.    Allergies:   Patient has no known allergies.    Social History:  The patient  reports that he has been smoking cigarettes. He has a 10.00 pack-year smoking history. He has never used smokeless tobacco. He reports current alcohol use. He reports current drug use. Drug: Marijuana.   Family History:  The patient's ***family history includes Diabetes in his mother; Healthy in his brother, brother, brother, sister, and sister; Hypertension in his mother.    ROS:  Please see the history of present illness.   Otherwise, review of systems are positive for {NONE DEFAULTED:18576::"none"}.   All other systems are reviewed and negative.    PHYSICAL EXAM: VS:  There were no vitals taken for this visit. , BMI There is no height or weight on file to calculate BMI. GENERAL:   Well appearing HEENT:  Pupils equal round and reactive, fundi not visualized, oral mucosa unremarkable NECK:  No jugular venous distention, waveform within normal limits, carotid upstroke brisk and symmetric, no bruits, no thyromegaly LYMPHATICS:  No cervical adenopathy LUNGS:  Clear to auscultation bilaterally HEART:  RRR.  PMI not displaced or sustained,S1 and S2 within normal limits, no S3, no S4, no clicks, no rubs, *** murmurs ABD:  Flat, positive bowel sounds normal in frequency in pitch, no bruits, no rebound, no guarding, no midline pulsatile mass, no hepatomegaly, no splenomegaly EXT:  2 plus pulses throughout, no edema, no cyanosis no clubbing SKIN:  No rashes no nodules NEURO:  Cranial nerves II through XII grossly intact, motor grossly intact throughout PSYCH:  Cognitively intact, oriented to person place and time    EKG:  EKG {ACTION; IS/IS SFK:[id number]} ordered today. The ekg ordered today demonstrates ***   Recent Labs: 06/25/2020: ALT 14; TSH 1.070 10/10/2020: BUN 14; Creatinine, Ser 1.09; Hemoglobin 14.3; Platelets 168; Potassium 4.5; Sodium 137    Lipid Panel    Component Value Date/Time   CHOL 140 06/25/2020 1230   TRIG 42 06/25/2020 1230   HDL 42 06/25/2020 1230   CHOLHDL 3.3 06/25/2020 1230   LDLCALC 88 06/25/2020 1230      Wt Readings from Last 3 Encounters:  07/08/20 219 lb (99.3 kg)  06/25/20 218 lb 3.2 oz (99 kg)  12/23/19  240 lb 6.4 oz (109 kg)      ASSESSMENT AND PLAN:  ***   Current medicines are reviewed at length with the patient today.  The patient {ACTIONS; HAS/DOES NOT HAVE:19233} concerns regarding medicines.  The following changes have been made:  {PLAN; NO CHANGE:13088:s}  Labs/ tests ordered today include: *** No orders of the defined types were placed in this encounter.    Disposition:   FU with ***     Signed, Adam Eustache C. Duke Salvia, MD, [facility]  10/20/2020 10:03 AM    [facility]

## 2020-10-28 ENCOUNTER — Encounter: Payer: Self-pay | Admitting: Cardiovascular Disease

## 2021-02-07 ENCOUNTER — Other Ambulatory Visit: Payer: Self-pay | Admitting: Nurse Practitioner

## 2021-02-07 DIAGNOSIS — Z716 Tobacco abuse counseling: Secondary | ICD-10-CM

## 2021-02-08 MED ORDER — NICOTINE 21 MG/24HR TD PT24: 21.0000 mg | MEDICATED_PATCH | Freq: Every day | TRANSDERMAL | 0 refills | Status: DC

## 2021-02-09 ENCOUNTER — Encounter: Payer: Self-pay | Admitting: Nurse Practitioner

## 2021-02-09 ENCOUNTER — Other Ambulatory Visit: Payer: Self-pay

## 2021-02-09 MED ORDER — VITAMIN D (ERGOCALCIFEROL) 1.25 MG (50000 UNIT) PO CAPS: 50000.0000 [IU] | ORAL_CAPSULE | ORAL | 1 refills | Status: DC

## 2021-03-04 ENCOUNTER — Ambulatory Visit (HOSPITAL_COMMUNITY)
Admission: EM | Admit: 2021-03-04 | Discharge: 2021-03-05 | Disposition: A | Discharge status: Home / Self Care | Payer: 59 | Attending: Emergency Medicine | Admitting: Emergency Medicine

## 2021-03-04 ENCOUNTER — Encounter (HOSPITAL_COMMUNITY)
Admission: EM | Disposition: A | Discharge status: Home / Self Care | Payer: Self-pay | Source: Home / Self Care | Attending: Emergency Medicine

## 2021-03-04 ENCOUNTER — Emergency Department (HOSPITAL_COMMUNITY): Payer: 59

## 2021-03-04 ENCOUNTER — Encounter (HOSPITAL_COMMUNITY): Payer: Self-pay | Admitting: Anesthesiology

## 2021-03-04 ENCOUNTER — Encounter (HOSPITAL_COMMUNITY): Payer: Self-pay

## 2021-03-04 ENCOUNTER — Other Ambulatory Visit: Payer: Self-pay

## 2021-03-04 DIAGNOSIS — F17210 Nicotine dependence, cigarettes, uncomplicated: Secondary | ICD-10-CM | POA: Insufficient documentation

## 2021-03-04 DIAGNOSIS — Z79899 Other long term (current) drug therapy: Secondary | ICD-10-CM | POA: Insufficient documentation

## 2021-03-04 DIAGNOSIS — T189XXA Foreign body of alimentary tract, part unspecified, initial encounter: Secondary | ICD-10-CM | POA: Insufficient documentation

## 2021-03-04 DIAGNOSIS — R070 Pain in throat: Secondary | ICD-10-CM | POA: Diagnosis not present

## 2021-03-04 DIAGNOSIS — K449 Diaphragmatic hernia without obstruction or gangrene: Secondary | ICD-10-CM | POA: Insufficient documentation

## 2021-03-04 HISTORY — PX: ESOPHAGOGASTRODUODENOSCOPY (EGD) WITH PROPOFOL: SHX5813

## 2021-03-04 HISTORY — DX: Other specified health status: Z78.9

## 2021-03-04 SURGERY — ESOPHAGOGASTRODUODENOSCOPY (EGD) WITH PROPOFOL
Anesthesia: Monitor Anesthesia Care

## 2021-03-04 MED ORDER — DIPHENHYDRAMINE HCL 50 MG/ML IJ SOLN
INTRAMUSCULAR | Status: AC
  Filled 2021-03-04: qty 1

## 2021-03-04 MED ORDER — DIPHENHYDRAMINE HCL 50 MG/ML IJ SOLN
INTRAMUSCULAR | Status: DC | PRN
  Administered 2021-03-04: 50 mg via INTRAVENOUS

## 2021-03-04 MED ORDER — MIDAZOLAM HCL (PF) 5 MG/ML IJ SOLN
INTRAMUSCULAR | Status: AC
  Filled 2021-03-04: qty 3

## 2021-03-04 MED ORDER — MIDAZOLAM HCL (PF) 10 MG/2ML IJ SOLN
INTRAMUSCULAR | Status: DC | PRN
  Administered 2021-03-04 (×4): 1 mg via INTRAVENOUS
  Administered 2021-03-04: 2 mg via INTRAVENOUS
  Administered 2021-03-04 (×2): 1 mg via INTRAVENOUS

## 2021-03-04 MED ORDER — FENTANYL CITRATE (PF) 100 MCG/2ML IJ SOLN
INTRAMUSCULAR | Status: DC | PRN
  Administered 2021-03-04: 25 ug via INTRAVENOUS
  Administered 2021-03-04: 50 ug via INTRAVENOUS
  Administered 2021-03-04: 25 ug via INTRAVENOUS

## 2021-03-04 MED ORDER — FENTANYL CITRATE (PF) 100 MCG/2ML IJ SOLN
INTRAMUSCULAR | Status: AC
  Filled 2021-03-04: qty 4

## 2021-03-04 SURGICAL SUPPLY — 15 items

## 2021-03-04 NOTE — ED Triage Notes (Signed)
Pt reports he was drinking alcohol and noted glass in his drink. Pt reports he feels "itchy scratchy" in his abd. Reports this happened 1 hour PTA.

## 2021-03-04 NOTE — H&P (View-Only) (Signed)
 HPI :  39 y/o male without any significant medical history, no surgical history, history of tobacco use, presenting to the ED with a foreign body ingestion. Dr. Dykstra consulted us for assistance in management.  The patient states he was at a restaurant having a drink with his friend this afternoon, when he took a shot of liquor. He did not realize it when he drank it, but there was shards of glass in the shot glass. He pulled one piece of glass out of his mouth after he swallowed the shot, but thinks he felt another small piece go down when he swallowed the shot. Since that happened his throat has been "irritated". No severe pain or discomfort, no significant odynophagia or dysphagia, but states his throat and chest felt irritated since this occurred. No abdominal pains.   In the ED he underwent noncontrast CT scan of the neck, chest, abdomen, pelvis - no evidence of obvious foreign body seen. He otherwise feels well without complaints. Denies any cardiopulmonary symptoms. He drinks socially but not routinely.   Incident occurred around 3PM or so. He had a small candy bar around 4 PM. Otherwise nothing else to eat or drink since then.  History reviewed. No pertinent past medical history.   History reviewed. No pertinent surgical history. Family History  Problem Relation Age of Onset   Hypertension Mother    Diabetes Mother    Healthy Sister    Healthy Sister    Healthy Brother    Healthy Brother    Healthy Brother    Social History   Tobacco Use   Smoking status: Every Day    Packs/day: 0.50    Years: 20.00    Pack years: 10.00    Types: Cigarettes   Smokeless tobacco: Never   Tobacco comments:    he has cut down to 3-4 cigarettes a day.   Substance Use Topics   Alcohol use: Yes    Comment: OCCASIONALLY   Drug use: Yes    Types: Marijuana   No current facility-administered medications for this encounter.   Current Outpatient Medications  Medication Sig Dispense Refill    albuterol (VENTOLIN HFA) 108 (90 Base) MCG/ACT inhaler Inhale 2 puffs into the lungs every 6 (six) hours as needed for wheezing or shortness of breath. 8 g 2   nicotine (NICODERM CQ - DOSED IN MG/24 HOURS) 21 mg/24hr patch Place 1 patch (21 mg total) onto the skin daily. 28 patch 0   Vitamin D, Ergocalciferol, (DRISDOL) 1.25 MG (50000 UNIT) CAPS capsule Take 1 capsule (50,000 Units total) by mouth every 7 (seven) days. 12 capsule 1   No Known Allergies   Review of Systems: All systems reviewed and negative except where noted in HPI.    CT ABDOMEN PELVIS WO CONTRAST  Result Date: 03/04/2021 CLINICAL DATA:  Swallowed foreign body, possible glass ingestion EXAM: CT CHEST, ABDOMEN AND PELVIS WITHOUT CONTRAST TECHNIQUE: Multidetector CT imaging of the chest, abdomen and pelvis was performed following the standard protocol without IV contrast. COMPARISON:  None. FINDINGS: CT CHEST FINDINGS Cardiovascular: No significant coronary artery calcification. Global cardiac size within normal limits. No pericardial effusion. The thoracic aorta is unremarkable. Mediastinum/Nodes: The thyroid is unremarkable. No pathologic adenopathy within the thorax. Residual thymic tissue within the anterior mediastinum. Esophagus is unremarkable. No pneumomediastinum. No definite foreign body identified within the trachea. Lungs/Pleura: 6 mm sub solid pulmonary nodule is seen within the left costophrenic angle, axial image # 144/5. 4 mm noncalcified pulmonary nodule seen   within the right upper lobe, axial image # 65/5. These are indeterminate. No pleural effusion or pneumothorax. Mild central bronchial wall thickening in keeping with airway inflammation. Musculoskeletal: No chest wall mass or suspicious bone lesions identified. CT ABDOMEN PELVIS FINDINGS Hepatobiliary: Indeterminate 2.3 cm low-attenuation lesion within the right hepatic dome may represent a hepatic cyst or hemangioma in a patient without a history of malignancy.  This is not well characterized on this examination, however. Similar 12 mm low-attenuation lesion noted within the right hepatic lobe at axial image # 64. The liver is otherwise unremarkable. Gallbladder unremarkable. No intra or extrahepatic biliary ductal dilation. Pancreas: Unremarkable Spleen: Unremarkable Adrenals/Urinary Tract: Adrenal glands are unremarkable. The kidneys are normal in size and position. A 16 mm low-attenuation lesion is seen within the interpolar region of the right kidney, indeterminate. This may simply represent a simple cortical cyst but is not well characterized on this examination. The kidneys are otherwise unremarkable. Bladder is unremarkable. Stomach/Bowel: Moderate sigmoid diverticulosis. The stomach, small bowel, and large bowel are otherwise unremarkable. Appendix normal. No free intraperitoneal gas or fluid. Vascular/Lymphatic: Duplicated IVC. The abdominal vasculature is otherwise unremarkable. No pathologic adenopathy within the abdomen and pelvis. Reproductive: Prostate is unremarkable. Other: No abdominal wall hernia.  Rectum unremarkable. Musculoskeletal: No acute bone abnormality identified. IMPRESSION: No definite intrathoracic or intra-abdominal foreign body identified. No pneumomediastinum or free intraperitoneal gas identified. Mild bronchial wall thickening in keeping with airway inflammation. Multiple indeterminate pulmonary nodules. No follow-up needed if patient is low-risk (and has no known or suspected primary neoplasm). Non-contrast chest CT can be considered in 12 months if patient is high-risk. This recommendation follows the consensus statement: Guidelines for Management of Incidental Pulmonary Nodules Detected on CT Images: From the Fleischner Society 2017; Radiology 2017; 284:228-243. Low-attenuation lesions within the a liver and right kidney are not well characterized on this examination but may simply represent small cyst. This could be confirmed with  dedicated sonography if desired. Electronically Signed   By: Helyn Numbers MD   On: 03/04/2021 20:49   DG Neck Soft Tissue  Result Date: 03/04/2021 CLINICAL DATA:  Throat discomfort for few days, question swallowed a piece of glass after taking a shot EXAM: NECK SOFT TISSUES - 1+ VIEW COMPARISON:  None FINDINGS: Prevertebral soft tissues normal thickness. Epiglottis and aryepiglottic folds normal appearance. Airway patent. No radiopaque foreign bodies identified. Osseous structures unremarkable. IMPRESSION: No radiopaque foreign bodies identified. Electronically Signed   By: Ulyses Southward M.D.   On: 03/04/2021 18:13   CT Soft Tissue Neck Wo Contrast  Result Date: 03/04/2021 CLINICAL DATA:  Initial evaluation for possible foreign body. EXAM: CT NECK WITHOUT CONTRAST TECHNIQUE: Multidetector CT imaging of the neck was performed following the standard protocol without intravenous contrast. COMPARISON:  Prior radiograph from earlier the same day. FINDINGS: Pharynx and larynx: Oral cavity within normal limits. No acute abnormality about the dentition. Punctate calcified tonsillith noted at the left palatine tonsil. Tonsils otherwise unremarkable. Remainder of the oropharynx and nasopharynx within normal limits. No retropharyngeal collection or swelling. Epiglottis normal. Vallecula largely clear. Remainder of the hypopharynx and supraglottic larynx normal. Subglottic airway patent and clear. Visualized upper esophagus unremarkable. No visible retained foreign body. Salivary glands: Salivary glands including the parotid and submandibular glands are normal. Thyroid: Normal. Lymph nodes: No enlarged or pathologic adenopathy within the neck. Vascular: Evaluation of the vascular structures limited by lack of IV contrast. Limited intracranial: Unremarkable. Visualized orbits: Unremarkable. Mastoids and visualized paranasal sinuses: Paranasal sinuses are largely  clear. Mastoid air cells and middle ear cavities are well  pneumatized and free of fluid. Skeleton: No acute osseous finding. No discrete or worrisome osseous lesions. Upper chest: Visualized upper chest demonstrates no acute finding, although better evaluated on concomitant CT of the chest. Other: None. IMPRESSION: Negative CT of the neck. No visible retained foreign body identified. Electronically Signed   By: Rise Mu M.D.   On: 03/04/2021 20:36   CT Chest Wo Contrast  Result Date: 03/04/2021 CLINICAL DATA:  Swallowed foreign body, possible glass ingestion EXAM: CT CHEST, ABDOMEN AND PELVIS WITHOUT CONTRAST TECHNIQUE: Multidetector CT imaging of the chest, abdomen and pelvis was performed following the standard protocol without IV contrast. COMPARISON:  None. FINDINGS: CT CHEST FINDINGS Cardiovascular: No significant coronary artery calcification. Global cardiac size within normal limits. No pericardial effusion. The thoracic aorta is unremarkable. Mediastinum/Nodes: The thyroid is unremarkable. No pathologic adenopathy within the thorax. Residual thymic tissue within the anterior mediastinum. Esophagus is unremarkable. No pneumomediastinum. No definite foreign body identified within the trachea. Lungs/Pleura: 6 mm sub solid pulmonary nodule is seen within the left costophrenic angle, axial image # 144/5. 4 mm noncalcified pulmonary nodule seen within the right upper lobe, axial image # 65/5. These are indeterminate. No pleural effusion or pneumothorax. Mild central bronchial wall thickening in keeping with airway inflammation. Musculoskeletal: No chest wall mass or suspicious bone lesions identified. CT ABDOMEN PELVIS FINDINGS Hepatobiliary: Indeterminate 2.3 cm low-attenuation lesion within the right hepatic dome may represent a hepatic cyst or hemangioma in a patient without a history of malignancy. This is not well characterized on this examination, however. Similar 12 mm low-attenuation lesion noted within the right hepatic lobe at axial image # 64.  The liver is otherwise unremarkable. Gallbladder unremarkable. No intra or extrahepatic biliary ductal dilation. Pancreas: Unremarkable Spleen: Unremarkable Adrenals/Urinary Tract: Adrenal glands are unremarkable. The kidneys are normal in size and position. A 16 mm low-attenuation lesion is seen within the interpolar region of the right kidney, indeterminate. This may simply represent a simple cortical cyst but is not well characterized on this examination. The kidneys are otherwise unremarkable. Bladder is unremarkable. Stomach/Bowel: Moderate sigmoid diverticulosis. The stomach, small bowel, and large bowel are otherwise unremarkable. Appendix normal. No free intraperitoneal gas or fluid. Vascular/Lymphatic: Duplicated IVC. The abdominal vasculature is otherwise unremarkable. No pathologic adenopathy within the abdomen and pelvis. Reproductive: Prostate is unremarkable. Other: No abdominal wall hernia.  Rectum unremarkable. Musculoskeletal: No acute bone abnormality identified. IMPRESSION: No definite intrathoracic or intra-abdominal foreign body identified. No pneumomediastinum or free intraperitoneal gas identified. Mild bronchial wall thickening in keeping with airway inflammation. Multiple indeterminate pulmonary nodules. No follow-up needed if patient is low-risk (and has no known or suspected primary neoplasm). Non-contrast chest CT can be considered in 12 months if patient is high-risk. This recommendation follows the consensus statement: Guidelines for Management of Incidental Pulmonary Nodules Detected on CT Images: From the Fleischner Society 2017; Radiology 2017; 284:228-243. Low-attenuation lesions within the a liver and right kidney are not well characterized on this examination but may simply represent small cyst. This could be confirmed with dedicated sonography if desired. Electronically Signed   By: Helyn Numbers MD   On: 03/04/2021 20:49    Physical Exam: BP 121/67 (BP Location: Left Arm)    Pulse 79   Temp 98.4 F (36.9 C) (Oral)   Resp 16   Ht 5\' 9"  (1.753 m)   Wt 102.1 kg   SpO2 100%   BMI 33.23 kg/m  Constitutional: Pleasant,well-developed, male in no acute distress. HEENT: Normocephalic and atraumatic. Conjunctivae are normal. No scleral icterus. Neck supple. No crepitus Cardiovascular: Normal rate, regular rhythm.  Pulmonary/chest: Effort normal and breath sounds normal.  Abdominal: Soft, nondistended, nontender.  There are no masses palpable.  Extremities: no edema Lymphadenopathy: No cervical adenopathy noted. Neurological: Alert and oriented to person place and time. Skin: Skin is warm and dry. No rashes noted. Psychiatric: Normal mood and affect. Behavior is normal.   ASSESSMENT AND PLAN: 40 y/o male presenting to the ED with suspected foreign body ingestion, small piece of glass. Has some irritation of his throat and upper chest, no severe pain. CT did not show any obvious foreign body or concerning changes otherwise for perforation, etc, although CT does not definitively rule out a retained small piece of glass. I reviewed the CT scan with radiologist - Dr. Ramiro Harvest. At this time, given his symptoms, recommending an EGD to clear his upper tract of any retained foreign body. If nothing  is noted he can be safely discharged home with precautions to monitor for abdominal pain, etc, over the next few days. If he has retained glass in his upper tract we will remove it if amenable. I have discussed risks / benefits of EGD, foreign body removal,and sedation with him, and he wishes to proceed. He has not eaten in 6 hours, CT shows his stomach is decompressed without any retained food. Anesthesia was contacted to help with sedation but they are not available to help [place] until later tonight. I think reasonable to do initial diagnostic exam with conscious sedation in the ED, if negative this should be a quick procedure to allow him to go home. If there is retained glass fragment  that we find, will perform endoscopic removal if amenable. In this situation, Dr. Stevie Kern of ED is available to intubate the patient to protect his airway if foreign body removal is needed. Patient in agreement with the plan, all questions answered. Further recommendations pending results of this exam.  Ileene Patrick, MD [facility]

## 2021-03-04 NOTE — Interval H&P Note (Signed)
History and Physical Interval Note:  03/04/2021 10:07 PM  Adam Lara  has presented today for surgery, with the diagnosis of foreign body ingestion.  The various methods of treatment have been discussed with the patient and family. After consideration of risks, benefits and other options for treatment, the patient has consented to  Procedure(s): ESOPHAGOGASTRODUODENOSCOPY (EGD) WITH PROPOFOL (N/A) as a surgical intervention.  The patient's history has been reviewed, patient examined, no change in status, stable for surgery.  I have reviewed the patient's chart and labs.  Questions were answered to the patient's satisfaction.     [name]

## 2021-03-04 NOTE — Op Note (Signed)
[facility] Patient Name: Adam Lara Procedure Date : 03/04/2021 MRN: [id number] Attending MD: Willaim Rayas. Adela Lank , MD Date of Birth: 08/30/1981 CSN: [id number] Age: 40 Admit Type: Emergency Department Procedure:                Upper GI endoscopy Indications:              Concern for foreign body ingestion (glass), patient                            with mild throat irritation / upper chest post                            ingestion, negative CT neck, chest, abdomen / pelvis Providers:                Willaim Rayas. Adela Lank, MD, Roselie Awkward, RN, Michele Mcalpine Technician Referring MD:              Medicines:                Fentanyl 100 micrograms IV, Midazolam 8 mg IV,                            Diphenhydramine 50 mg IV Complications:            No immediate complications. Estimated blood loss:                            Minimal. Estimated Blood Loss:     Estimated blood loss was minimal. Procedure:                Pre-Anesthesia Assessment:                           - Prior to the procedure, a History and Physical                            was performed, and patient medications and                            allergies were reviewed. The patient's tolerance of                            previous anesthesia was also reviewed. The risks                            and benefits of the procedure and the sedation                            options and risks were discussed with the patient.                            All questions were answered, and informed consent  was obtained. Prior Anticoagulants: The patient has                            taken no previous anticoagulant or antiplatelet                            agents. ASA Grade Assessment: II - A patient with                            mild systemic disease. After reviewing the risks                            and benefits, the patient was deemed in                             satisfactory condition to undergo the procedure.                           After obtaining informed consent, the endoscope was                            passed under direct vision. Throughout the                            procedure, the patient's blood pressure, pulse, and                            oxygen saturations were monitored continuously. The                            [id number]-[id number] ([id number] Olympus gastroscope was                            introduced through the mouth, and advanced to the                            second part of duodenum. The upper GI endoscopy was                            accomplished without difficulty. The patient                            tolerated the procedure well. Scope In: Scope Out: Findings:      The oropharynx was examined and appeared normal without any obvious       retained foreign body or laceration, however some views of the       orophayrnx not well obtained with upper endoscope.      A small hiatal hernia was present.      The exam of the esophagus was otherwise normal.      The entire examined stomach was normal.      The duodenal bulb and second portion of the duodenum were normal. Impression:               - Normal oropharynx as visualized with upper  endoscope.                           - Small hiatal hernia.                           - Normal esophagus.                           - Normal stomach.                           - Normal duodenal bulb and second portion of the                            duodenum.                           Overall, no evidence of retained glass / foreign                            body or evidence of laceration / mucosal injury.                            Upper endoscope not test of choice for posterior                            pharynx, if concern persists for retained foreign                            body in that area would need laryngoscopy per ENT.                             Symptoms mild, suspicion for retained glass shard                            at this point in time appears low. I think stable                            for discharge with monitoring if the patient has                            any worsening symptoms would need ENT evaluation.                            Also needs to monitor for abdominal pain, blood in                            stool, etc, if ingested foreign body has entered                            the small / large bowel. He should contact us  should this occur. Moderate Sedation:      Moderate (conscious) sedation was administered by the endoscopy nurse       and supervised by the endoscopist. The patient's oxygen saturation,       heart rate, blood pressure and response to care were monitored. Total       physician intraservice time was 31 minutes. Recommendation:           - Patient has a contact number available for                            emergencies. The signs and symptoms of potential                            delayed complications were discussed with the                            patient. Return to normal activities tomorrow.                            Written discharge instructions were provided to the                            patient.                           - Okay to discharge home                           - Advance diet as tolerated.                           - Continue present medications.                           - Patient can go home when recovers from sedation                            if he has a ride home. Procedure Code(s):        --- Professional ---                           [phone], Esophagogastroduodenoscopy, flexible,                            transoral; diagnostic, including collection of                            specimen(s) by brushing or washing, when performed                            (separate procedure)                           99152, 59, Moderate  sedation services provided by  the same physician or other qualified health care                            professional performing the diagnostic or                            therapeutic service that the sedation supports,                            requiring the presence of an independent trained                            observer to assist in the monitoring of the                            patient's level of consciousness and physiological                            status; initial 15 minutes of intraservice time,                            patient age 27 years or older                           [phone], Moderate sedation; each additional 15                            minutes intraservice time Diagnosis Code(s):        --- Professional ---                           K44.9, Diaphragmatic hernia without obstruction or                            gangrene                           T18.9XXA, Foreign body of alimentary tract, part                            unspecified, initial encounter CPT copyright 2019 [employer]. All rights reserved. The codes documented in this report are preliminary and upon coder review may  be revised to meet current compliance requirements. Viviann Spare P. Holli Rengel, MD 03/04/2021 11:06:48 PM This report has been signed electronically. Number of Addenda: 0

## 2021-03-04 NOTE — ED Provider Notes (Signed)
[facility] EMERGENCY DEPARTMENT Provider Note   CSN: [id number] Arrival date & time: 03/04/21  1640     History Chief Complaint  Patient presents with   Possible Glass Ingestion    Adam Lara is a 40 y.o. male without contributing medical history who presents with throat pain with onset 1 hour prior to arrival. Patient states that after taking a shot at a bar, he noticed something on his tongue and found it was a shard of glass about 1cm long. He is not completely sure if there was anything else or if he swallowed any, but did feel some discomfort in his throat. He then went home and changed and was preparing to go to work, but had persistent mild throat pain so came to the ED. Has not noticed any blood in his mouth. No vomiting, cough. Has been able to tolerate liquids and food. Had a snack shortly before coming to the ED. Does not have pain in his abdomen.  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 No pertinent surgical history.    Family History  Problem Relation Age of Onset   Hypertension Mother    Diabetes Mother    Healthy Sister    Healthy Sister    Healthy Brother    Healthy Brother    Healthy Brother     Social History   Tobacco Use   Smoking status: Every Day    Packs/day: 0.50    Years: 20.00    Pack years: 10.00    Types: Cigarettes   Smokeless tobacco: Never   Tobacco comments:    he has cut down to 3-4 cigarettes a day.   Substance Use Topics   Alcohol use: Yes    Comment: OCCASIONALLY   Drug use: Yes    Types: Marijuana    Home Medications Prior to Admission medications   Medication Sig Start Date End Date Taking? Authorizing Provider  albuterol (VENTOLIN HFA) 108 (90 Base) MCG/ACT inhaler  Inhale 2 puffs into the lungs every 6 (six) hours as needed for wheezing or shortness of breath. 10/08/20   Arnette Felts, [name]  nicotine (NICODERM CQ - DOSED IN MG/24 HOURS) 21 mg/24hr patch Place 1 patch (21 mg total) onto the skin daily. 02/08/21   Arnette Felts, [name]  Vitamin D, Ergocalciferol, (DRISDOL) 1.25 MG (50000 UNIT) CAPS capsule Take 1 capsule (50,000 Units total) by mouth every 7 (seven) days. 02/09/21   Arnette Felts, [name]    Allergies    Patient has no known allergies.  Review of Systems   Review of Systems  HENT:  Positive for sore throat. Negative for trouble swallowing.   Cardiovascular:  Negative for chest pain.  Gastrointestinal:  Negative for abdominal pain, nausea and vomiting.  Hematological:  Does not bruise/bleed easily.  All other systems reviewed and are negative.  Physical Exam Updated Vital Signs BP 131/89   Pulse 68   Temp 98.4 F (36.9 C) (Oral)   Resp 20   Ht  (1.753 m)   Wt 102.1 kg   SpO2 100%   BMI 33.23 kg/m   Physical Exam Vitals and nursing note reviewed.  Constitutional:      Appearance: Normal appearance.  HENT:  Head: Normocephalic and atraumatic.     Right Ear: External ear normal.     Left Ear: External ear normal.     Nose: Nose normal.     Mouth/Throat:     Mouth: Mucous membranes are moist.     Pharynx: Oropharynx is clear. No oropharyngeal exudate or posterior oropharyngeal erythema.     Comments: No bleeding in posterior oropharynx Eyes:     Extraocular Movements: Extraocular movements intact.  Cardiovascular:     Rate and Rhythm: Normal rate and regular rhythm.     Heart sounds: No murmur heard.   No friction rub. No gallop.  Pulmonary:     Effort: Pulmonary effort is normal.     Breath sounds: No stridor.  Abdominal:     General: Abdomen is flat.     Tenderness: There is no abdominal tenderness.  Musculoskeletal:        General: No swelling or deformity. Normal range of motion.     Cervical back: Normal range  of motion and neck supple.  Skin:    General: Skin is warm and dry.  Neurological:     General: No focal deficit present.     Mental Status: He is alert and oriented to person, place, and time.  Psychiatric:        Mood and Affect: Mood normal.        Behavior: Behavior normal.    ED Results / Procedures / Treatments   Labs (all labs ordered are listed, but only abnormal results are displayed) Labs Reviewed - No data to display  EKG None  Radiology CT ABDOMEN PELVIS WO CONTRAST  Result Date: 03/04/2021 CLINICAL DATA:  Swallowed foreign body, possible glass ingestion EXAM: CT CHEST, ABDOMEN AND PELVIS WITHOUT CONTRAST TECHNIQUE: Multidetector CT imaging of the chest, abdomen and pelvis was performed following the standard protocol without IV contrast. COMPARISON:  None. FINDINGS: CT CHEST FINDINGS Cardiovascular: No significant coronary artery calcification. Global cardiac size within normal limits. No pericardial effusion. The thoracic aorta is unremarkable. Mediastinum/Nodes: The thyroid is unremarkable. No pathologic adenopathy within the thorax. Residual thymic tissue within the anterior mediastinum. Esophagus is unremarkable. No pneumomediastinum. No definite foreign body identified within the trachea. Lungs/Pleura: 6 mm sub solid pulmonary nodule is seen within the left costophrenic angle, axial image # 144/5. 4 mm noncalcified pulmonary nodule seen within the right upper lobe, axial image # 65/5. These are indeterminate. No pleural effusion or pneumothorax. Mild central bronchial wall thickening in keeping with airway inflammation. Musculoskeletal: No chest wall mass or suspicious bone lesions identified. CT ABDOMEN PELVIS FINDINGS Hepatobiliary: Indeterminate 2.3 cm low-attenuation lesion within the right hepatic dome may represent a hepatic cyst or hemangioma in a patient without a history of malignancy. This is not well characterized on this examination, however. Similar 12 mm  low-attenuation lesion noted within the right hepatic lobe at axial image # 64. The liver is otherwise unremarkable. Gallbladder unremarkable. No intra or extrahepatic biliary ductal dilation. Pancreas: Unremarkable Spleen: Unremarkable Adrenals/Urinary Tract: Adrenal glands are unremarkable. The kidneys are normal in size and position. A 16 mm low-attenuation lesion is seen within the interpolar region of the right kidney, indeterminate. This may simply represent a simple cortical cyst but is not well characterized on this examination. The kidneys are otherwise unremarkable. Bladder is unremarkable. Stomach/Bowel: Moderate sigmoid diverticulosis. The stomach, small bowel, and large bowel are otherwise unremarkable. Appendix normal. No free intraperitoneal gas or fluid. Vascular/Lymphatic: Duplicated IVC. The abdominal vasculature is otherwise unremarkable. No  pathologic adenopathy within the abdomen and pelvis. Reproductive: Prostate is unremarkable. Other: No abdominal wall hernia.  Rectum unremarkable. Musculoskeletal: No acute bone abnormality identified. IMPRESSION: No definite intrathoracic or intra-abdominal foreign body identified. No pneumomediastinum or free intraperitoneal gas identified. Mild bronchial wall thickening in keeping with airway inflammation. Multiple indeterminate pulmonary nodules. No follow-up needed if patient is low-risk (and has no known or suspected primary neoplasm). Non-contrast chest CT can be considered in 12 months if patient is high-risk. This recommendation follows the consensus statement: Guidelines for Management of Incidental Pulmonary Nodules Detected on CT Images: From the Fleischner Society 2017; Radiology 2017; 284:228-243. Low-attenuation lesions within the a liver and right kidney are not well characterized on this examination but may simply represent small cyst. This could be confirmed with dedicated sonography if desired. Electronically Signed   By: Helyn Numbers MD    On: 03/04/2021 20:49   DG Neck Soft Tissue  Result Date: 03/04/2021 CLINICAL DATA:  Throat discomfort for few days, question swallowed a piece of glass after taking a shot EXAM: NECK SOFT TISSUES - 1+ VIEW COMPARISON:  None FINDINGS: Prevertebral soft tissues normal thickness. Epiglottis and aryepiglottic folds normal appearance. Airway patent. No radiopaque foreign bodies identified. Osseous structures unremarkable. IMPRESSION: No radiopaque foreign bodies identified. Electronically Signed   By: Ulyses Southward M.D.   On: 03/04/2021 18:13   CT Soft Tissue Neck Wo Contrast  Result Date: 03/04/2021 CLINICAL DATA:  Initial evaluation for possible foreign body. EXAM: CT NECK WITHOUT CONTRAST TECHNIQUE: Multidetector CT imaging of the neck was performed following the standard protocol without intravenous contrast. COMPARISON:  Prior radiograph from earlier the same day. FINDINGS: Pharynx and larynx: Oral cavity within normal limits. No acute abnormality about the dentition. Punctate calcified tonsillith noted at the left palatine tonsil. Tonsils otherwise unremarkable. Remainder of the oropharynx and nasopharynx within normal limits. No retropharyngeal collection or swelling. Epiglottis normal. Vallecula largely clear. Remainder of the hypopharynx and supraglottic larynx normal. Subglottic airway patent and clear. Visualized upper esophagus unremarkable. No visible retained foreign body. Salivary glands: Salivary glands including the parotid and submandibular glands are normal. Thyroid: Normal. Lymph nodes: No enlarged or pathologic adenopathy within the neck. Vascular: Evaluation of the vascular structures limited by lack of IV contrast. Limited intracranial: Unremarkable. Visualized orbits: Unremarkable. Mastoids and visualized paranasal sinuses: Paranasal sinuses are largely clear. Mastoid air cells and middle ear cavities are well pneumatized and free of fluid. Skeleton: No acute osseous finding. No discrete or  worrisome osseous lesions. Upper chest: Visualized upper chest demonstrates no acute finding, although better evaluated on concomitant CT of the chest. Other: None. IMPRESSION: Negative CT of the neck. No visible retained foreign body identified. Electronically Signed   By: Rise Mu M.D.   On: 03/04/2021 20:36   CT Chest Wo Contrast  Result Date: 03/04/2021 CLINICAL DATA:  Swallowed foreign body, possible glass ingestion EXAM: CT CHEST, ABDOMEN AND PELVIS WITHOUT CONTRAST TECHNIQUE: Multidetector CT imaging of the chest, abdomen and pelvis was performed following the standard protocol without IV contrast. COMPARISON:  None. FINDINGS: CT CHEST FINDINGS Cardiovascular: No significant coronary artery calcification. Global cardiac size within normal limits. No pericardial effusion. The thoracic aorta is unremarkable. Mediastinum/Nodes: The thyroid is unremarkable. No pathologic adenopathy within the thorax. Residual thymic tissue within the anterior mediastinum. Esophagus is unremarkable. No pneumomediastinum. No definite foreign body identified within the trachea. Lungs/Pleura: 6 mm sub solid pulmonary nodule is seen within the left costophrenic angle, axial image # 144/5. 4 mm  noncalcified pulmonary nodule seen within the right upper lobe, axial image # 65/5. These are indeterminate. No pleural effusion or pneumothorax. Mild central bronchial wall thickening in keeping with airway inflammation. Musculoskeletal: No chest wall mass or suspicious bone lesions identified. CT ABDOMEN PELVIS FINDINGS Hepatobiliary: Indeterminate 2.3 cm low-attenuation lesion within the right hepatic dome may represent a hepatic cyst or hemangioma in a patient without a history of malignancy. This is not well characterized on this examination, however. Similar 12 mm low-attenuation lesion noted within the right hepatic lobe at axial image # 64. The liver is otherwise unremarkable. Gallbladder unremarkable. No intra or  extrahepatic biliary ductal dilation. Pancreas: Unremarkable Spleen: Unremarkable Adrenals/Urinary Tract: Adrenal glands are unremarkable. The kidneys are normal in size and position. A 16 mm low-attenuation lesion is seen within the interpolar region of the right kidney, indeterminate. This may simply represent a simple cortical cyst but is not well characterized on this examination. The kidneys are otherwise unremarkable. Bladder is unremarkable. Stomach/Bowel: Moderate sigmoid diverticulosis. The stomach, small bowel, and large bowel are otherwise unremarkable. Appendix normal. No free intraperitoneal gas or fluid. Vascular/Lymphatic: Duplicated IVC. The abdominal vasculature is otherwise unremarkable. No pathologic adenopathy within the abdomen and pelvis. Reproductive: Prostate is unremarkable. Other: No abdominal wall hernia.  Rectum unremarkable. Musculoskeletal: No acute bone abnormality identified. IMPRESSION: No definite intrathoracic or intra-abdominal foreign body identified. No pneumomediastinum or free intraperitoneal gas identified. Mild bronchial wall thickening in keeping with airway inflammation. Multiple indeterminate pulmonary nodules. No follow-up needed if patient is low-risk (and has no known or suspected primary neoplasm). Non-contrast chest CT can be considered in 12 months if patient is high-risk. This recommendation follows the consensus statement: Guidelines for Management of Incidental Pulmonary Nodules Detected on CT Images: From the Fleischner Society 2017; Radiology 2017; 284:228-243. Low-attenuation lesions within the a liver and right kidney are not well characterized on this examination but may simply represent small cyst. This could be confirmed with dedicated sonography if desired. Electronically Signed   By: Helyn Numbers MD   On: 03/04/2021 20:49    Procedures Procedures   Medications Ordered in ED Medications - No data to display  ED Course  I have reviewed the  triage vital signs and the nursing notes.  Pertinent labs & imaging results that were available during my care of the patient were reviewed by me and considered in my medical decision making (see chart for details).    MDM Rules/Calculators/A&P                          Patient presents after finding a shard of glass in his mouth after taking a shot earlier today, has since had persistent throat pain. No blood in his mouth, no vomiting, tolerating liquids and solids. No stridor on exam. No throat or abdominal tenderness. Xray of neck is normal.   Spoke with radiologist who informed me that CT or MRI would be unlikely to visualize a piece of glass. They would be able to identify complications of a foreign body such as edema or laceration, so a positive result would be informative but a negative result would not. Discussed this with GI. Recommended getting CT of neck, chest, and abdomen. Pending results of these for further management.  CT of neck, chest, and abdomen is negative for foreign body or complication of one. Patient states that he feels about the same, still having throat pain. Discussed with GI who will evaluate the patient.  GI performed endoscopy and did not find any evidence of foreign body or of injury. Patient's vitals have remained stable. No evidence of bleeding. Will discharge with return precautions.  Final Clinical Impression(s) / ED Diagnoses Final diagnoses:  Swallowed foreign body, initial encounter    Rx / DC Orders ED Discharge Orders     None        Remo Lipps, MD 03/04/21 2245    Milagros Loll, MD 03/07/21 2352

## 2021-03-04 NOTE — ED Provider Notes (Signed)
Emergency Medicine Provider Triage Evaluation Note  Adam Lara , a 40 y.o. male  was evaluated in triage.  Pt complains of foreign body ingestion. Drinking at restaurant [employer]. Took the last sip and noted sharp object on tongue. Pulled out either glass or plastic. States he feels weird to throat now. No bleeding, abd pain   Review of Systems  Positive: Sensation of foreign body  Negative: Abd pain, emesis, bleeding  Physical Exam  BP (!) 131/91   Pulse 95   Temp 98.7 F (37.1 C) (Oral)   Resp 15   Ht 5\' 9"  (1.753 m)   Wt 102.1 kg   SpO2 100%   BMI 33.23 kg/m  Gen:   Awake, no distress  Mouth:  PO clear. Neck :  full ROM wo difficulty Resp:  Normal effort  MSK:   Moves extremities without difficulty  Other:    Medical Decision Making  Medically screening exam initiated at 5:06 PM.  Appropriate orders placed.  Adam Lara was informed that the remainder of the evaluation will be completed by another provider, this initial triage assessment does not replace that evaluation, and the importance of remaining in the ED until their evaluation is complete.  Sensation of foreign body to throat.   Akanksha Bellmore A, PA-C 03/04/21 1706    05/04/21, MD 03/04/21 2324

## 2021-03-04 NOTE — Consult Note (Signed)
HPI :  40 y/o male without any significant medical history, no surgical history, history of tobacco use, presenting to the ED with a foreign body ingestion. Dr. Stevie Kernykstra consulted us for assistance in management.  The patient states he was at a restaurant having a drink with his friend this afternoon, when he took a shot of liquor. He did not realize it when he drank it, but there was shards of glass in the shot glass. He pulled one piece of glass out of his mouth after he swallowed the shot, but thinks he felt another small piece go down when he swallowed the shot. Since that happened his throat has been "irritated". No severe pain or discomfort, no significant odynophagia or dysphagia, but states his throat and chest felt irritated since this occurred. No abdominal pains.   In the ED he underwent noncontrast CT scan of the neck, chest, abdomen, pelvis - no evidence of obvious foreign body seen. He otherwise feels well without complaints. Denies any cardiopulmonary symptoms. He drinks socially but not routinely.   Incident occurred around 3PM or so. He had a small candy bar around 4 PM. Otherwise nothing else to eat or drink since then.  History reviewed. No pertinent past medical history.   History reviewed. No pertinent surgical history. Family History  Problem Relation Age of Onset   Hypertension Mother    Diabetes Mother    Healthy Sister    Healthy Sister    Healthy Brother    Healthy Brother    Healthy Brother    Social History   Tobacco Use   Smoking status: Every Day    Packs/day: 0.50    Years: 20.00    Pack years: 10.00    Types: Cigarettes   Smokeless tobacco: Never   Tobacco comments:    he has cut down to 3-4 cigarettes a day.   Substance Use Topics   Alcohol use: Yes    Comment: OCCASIONALLY   Drug use: Yes    Types: Marijuana   No current facility-administered medications for this encounter.   Current Outpatient Medications  Medication Sig Dispense Refill    albuterol (VENTOLIN HFA) 108 (90 Base) MCG/ACT inhaler Inhale 2 puffs into the lungs every 6 (six) hours as needed for wheezing or shortness of breath. 8 g 2   nicotine (NICODERM CQ - DOSED IN MG/24 HOURS) 21 mg/24hr patch Place 1 patch (21 mg total) onto the skin daily. 28 patch 0   Vitamin D, Ergocalciferol, (DRISDOL) 1.25 MG (50000 UNIT) CAPS capsule Take 1 capsule (50,000 Units total) by mouth every 7 (seven) days. 12 capsule 1   No Known Allergies   Review of Systems: All systems reviewed and negative except where noted in HPI.    CT ABDOMEN PELVIS WO CONTRAST  Result Date: 03/04/2021 CLINICAL DATA:  Swallowed foreign body, possible glass ingestion EXAM: CT CHEST, ABDOMEN AND PELVIS WITHOUT CONTRAST TECHNIQUE: Multidetector CT imaging of the chest, abdomen and pelvis was performed following the standard protocol without IV contrast. COMPARISON:  None. FINDINGS: CT CHEST FINDINGS Cardiovascular: No significant coronary artery calcification. Global cardiac size within normal limits. No pericardial effusion. The thoracic aorta is unremarkable. Mediastinum/Nodes: The thyroid is unremarkable. No pathologic adenopathy within the thorax. Residual thymic tissue within the anterior mediastinum. Esophagus is unremarkable. No pneumomediastinum. No definite foreign body identified within the trachea. Lungs/Pleura: 6 mm sub solid pulmonary nodule is seen within the left costophrenic angle, axial image # 144/5. 4 mm noncalcified pulmonary nodule seen  within the right upper lobe, axial image # 65/5. These are indeterminate. No pleural effusion or pneumothorax. Mild central bronchial wall thickening in keeping with airway inflammation. Musculoskeletal: No chest wall mass or suspicious bone lesions identified. CT ABDOMEN PELVIS FINDINGS Hepatobiliary: Indeterminate 2.3 cm low-attenuation lesion within the right hepatic dome may represent a hepatic cyst or hemangioma in a patient without a history of malignancy.  This is not well characterized on this examination, however. Similar 12 mm low-attenuation lesion noted within the right hepatic lobe at axial image # 64. The liver is otherwise unremarkable. Gallbladder unremarkable. No intra or extrahepatic biliary ductal dilation. Pancreas: Unremarkable Spleen: Unremarkable Adrenals/Urinary Tract: Adrenal glands are unremarkable. The kidneys are normal in size and position. A 16 mm low-attenuation lesion is seen within the interpolar region of the right kidney, indeterminate. This may simply represent a simple cortical cyst but is not well characterized on this examination. The kidneys are otherwise unremarkable. Bladder is unremarkable. Stomach/Bowel: Moderate sigmoid diverticulosis. The stomach, small bowel, and large bowel are otherwise unremarkable. Appendix normal. No free intraperitoneal gas or fluid. Vascular/Lymphatic: Duplicated IVC. The abdominal vasculature is otherwise unremarkable. No pathologic adenopathy within the abdomen and pelvis. Reproductive: Prostate is unremarkable. Other: No abdominal wall hernia.  Rectum unremarkable. Musculoskeletal: No acute bone abnormality identified. IMPRESSION: No definite intrathoracic or intra-abdominal foreign body identified. No pneumomediastinum or free intraperitoneal gas identified. Mild bronchial wall thickening in keeping with airway inflammation. Multiple indeterminate pulmonary nodules. No follow-up needed if patient is low-risk (and has no known or suspected primary neoplasm). Non-contrast chest CT can be considered in 12 months if patient is high-risk. This recommendation follows the consensus statement: Guidelines for Management of Incidental Pulmonary Nodules Detected on CT Images: From the Fleischner Society 2017; Radiology 2017; 284:228-243. Low-attenuation lesions within the a liver and right kidney are not well characterized on this examination but may simply represent small cyst. This could be confirmed with  dedicated sonography if desired. Electronically Signed   By: Helyn Numbers MD   On: 03/04/2021 20:49   DG Neck Soft Tissue  Result Date: 03/04/2021 CLINICAL DATA:  Throat discomfort for few days, question swallowed a piece of glass after taking a shot EXAM: NECK SOFT TISSUES - 1+ VIEW COMPARISON:  None FINDINGS: Prevertebral soft tissues normal thickness. Epiglottis and aryepiglottic folds normal appearance. Airway patent. No radiopaque foreign bodies identified. Osseous structures unremarkable. IMPRESSION: No radiopaque foreign bodies identified. Electronically Signed   By: Ulyses Southward M.D.   On: 03/04/2021 18:13   CT Soft Tissue Neck Wo Contrast  Result Date: 03/04/2021 CLINICAL DATA:  Initial evaluation for possible foreign body. EXAM: CT NECK WITHOUT CONTRAST TECHNIQUE: Multidetector CT imaging of the neck was performed following the standard protocol without intravenous contrast. COMPARISON:  Prior radiograph from earlier the same day. FINDINGS: Pharynx and larynx: Oral cavity within normal limits. No acute abnormality about the dentition. Punctate calcified tonsillith noted at the left palatine tonsil. Tonsils otherwise unremarkable. Remainder of the oropharynx and nasopharynx within normal limits. No retropharyngeal collection or swelling. Epiglottis normal. Vallecula largely clear. Remainder of the hypopharynx and supraglottic larynx normal. Subglottic airway patent and clear. Visualized upper esophagus unremarkable. No visible retained foreign body. Salivary glands: Salivary glands including the parotid and submandibular glands are normal. Thyroid: Normal. Lymph nodes: No enlarged or pathologic adenopathy within the neck. Vascular: Evaluation of the vascular structures limited by lack of IV contrast. Limited intracranial: Unremarkable. Visualized orbits: Unremarkable. Mastoids and visualized paranasal sinuses: Paranasal sinuses are largely  clear. Mastoid air cells and middle ear cavities are well  pneumatized and free of fluid. Skeleton: No acute osseous finding. No discrete or worrisome osseous lesions. Upper chest: Visualized upper chest demonstrates no acute finding, although better evaluated on concomitant CT of the chest. Other: None. IMPRESSION: Negative CT of the neck. No visible retained foreign body identified. Electronically Signed   By: Rise Mu M.D.   On: 03/04/2021 20:36   CT Chest Wo Contrast  Result Date: 03/04/2021 CLINICAL DATA:  Swallowed foreign body, possible glass ingestion EXAM: CT CHEST, ABDOMEN AND PELVIS WITHOUT CONTRAST TECHNIQUE: Multidetector CT imaging of the chest, abdomen and pelvis was performed following the standard protocol without IV contrast. COMPARISON:  None. FINDINGS: CT CHEST FINDINGS Cardiovascular: No significant coronary artery calcification. Global cardiac size within normal limits. No pericardial effusion. The thoracic aorta is unremarkable. Mediastinum/Nodes: The thyroid is unremarkable. No pathologic adenopathy within the thorax. Residual thymic tissue within the anterior mediastinum. Esophagus is unremarkable. No pneumomediastinum. No definite foreign body identified within the trachea. Lungs/Pleura: 6 mm sub solid pulmonary nodule is seen within the left costophrenic angle, axial image # 144/5. 4 mm noncalcified pulmonary nodule seen within the right upper lobe, axial image # 65/5. These are indeterminate. No pleural effusion or pneumothorax. Mild central bronchial wall thickening in keeping with airway inflammation. Musculoskeletal: No chest wall mass or suspicious bone lesions identified. CT ABDOMEN PELVIS FINDINGS Hepatobiliary: Indeterminate 2.3 cm low-attenuation lesion within the right hepatic dome may represent a hepatic cyst or hemangioma in a patient without a history of malignancy. This is not well characterized on this examination, however. Similar 12 mm low-attenuation lesion noted within the right hepatic lobe at axial image # 64.  The liver is otherwise unremarkable. Gallbladder unremarkable. No intra or extrahepatic biliary ductal dilation. Pancreas: Unremarkable Spleen: Unremarkable Adrenals/Urinary Tract: Adrenal glands are unremarkable. The kidneys are normal in size and position. A 16 mm low-attenuation lesion is seen within the interpolar region of the right kidney, indeterminate. This may simply represent a simple cortical cyst but is not well characterized on this examination. The kidneys are otherwise unremarkable. Bladder is unremarkable. Stomach/Bowel: Moderate sigmoid diverticulosis. The stomach, small bowel, and large bowel are otherwise unremarkable. Appendix normal. No free intraperitoneal gas or fluid. Vascular/Lymphatic: Duplicated IVC. The abdominal vasculature is otherwise unremarkable. No pathologic adenopathy within the abdomen and pelvis. Reproductive: Prostate is unremarkable. Other: No abdominal wall hernia.  Rectum unremarkable. Musculoskeletal: No acute bone abnormality identified. IMPRESSION: No definite intrathoracic or intra-abdominal foreign body identified. No pneumomediastinum or free intraperitoneal gas identified. Mild bronchial wall thickening in keeping with airway inflammation. Multiple indeterminate pulmonary nodules. No follow-up needed if patient is low-risk (and has no known or suspected primary neoplasm). Non-contrast chest CT can be considered in 12 months if patient is high-risk. This recommendation follows the consensus statement: Guidelines for Management of Incidental Pulmonary Nodules Detected on CT Images: From the Fleischner Society 2017; Radiology 2017; 284:228-243. Low-attenuation lesions within the a liver and right kidney are not well characterized on this examination but may simply represent small cyst. This could be confirmed with dedicated sonography if desired. Electronically Signed   By: Helyn Numbers MD   On: 03/04/2021 20:49    Physical Exam: BP 121/67 (BP Location: Left Arm)    Pulse 79   Temp 98.4 F (36.9 C) (Oral)   Resp 16   Ht 5\' 9"  (1.753 m)   Wt 102.1 kg   SpO2 100%   BMI 33.23 kg/m  Constitutional: Pleasant,well-developed, male in no acute distress. HEENT: Normocephalic and atraumatic. Conjunctivae are normal. No scleral icterus. Neck supple. No crepitus Cardiovascular: Normal rate, regular rhythm.  Pulmonary/chest: Effort normal and breath sounds normal.  Abdominal: Soft, nondistended, nontender.  There are no masses palpable.  Extremities: no edema Lymphadenopathy: No cervical adenopathy noted. Neurological: Alert and oriented to person place and time. Skin: Skin is warm and dry. No rashes noted. Psychiatric: Normal mood and affect. Behavior is normal.   ASSESSMENT AND PLAN: 40 y/o male presenting to the ED with suspected foreign body ingestion, small piece of glass. Has some irritation of his throat and upper chest, no severe pain. CT did not show any obvious foreign body or concerning changes otherwise for perforation, etc, although CT does not definitively rule out a retained small piece of glass. I reviewed the CT scan with radiologist - Dr. Ramiro Harvest. At this time, given his symptoms, recommending an EGD to clear his upper tract of any retained foreign body. If nothing  is noted he can be safely discharged home with precautions to monitor for abdominal pain, etc, over the next few days. If he has retained glass in his upper tract we will remove it if amenable. I have discussed risks / benefits of EGD, foreign body removal,and sedation with him, and he wishes to proceed. He has not eaten in 6 hours, CT shows his stomach is decompressed without any retained food. Anesthesia was contacted to help with sedation but they are not available to help [place] until later tonight. I think reasonable to do initial diagnostic exam with conscious sedation in the ED, if negative this should be a quick procedure to allow him to go home. If there is retained glass fragment  that we find, will perform endoscopic removal if amenable. In this situation, Dr. Stevie Kern of ED is available to intubate the patient to protect his airway if foreign body removal is needed. Patient in agreement with the plan, all questions answered. Further recommendations pending results of this exam.  Ileene Patrick, MD [facility]

## 2021-03-04 NOTE — Anesthesia Preprocedure Evaluation (Deleted)
Anesthesia Evaluation    Reviewed: Allergy & Precautions, Patient's Chart, lab work & pertinent test results  Airway        Dental   Pulmonary Current Smoker,           Cardiovascular negative cardio ROS       Neuro/Psych negative neurological ROS     GI/Hepatic negative GI ROS, (+)     substance abuse  ,   Endo/Other  negative endocrine ROS  Renal/GU negative Renal ROS     Musculoskeletal negative musculoskeletal ROS (+)   Abdominal   Peds  Hematology negative hematology ROS (+)   Anesthesia Other Findings foreign body ingestion  Reproductive/Obstetrics                             Anesthesia Physical Anesthesia Plan  ASA: emergent  Anesthesia Plan:    Post-op Pain Management:    Induction:   PONV Risk Score and Plan:   Airway Management Planned:   Additional Equipment:   Intra-op Plan:   Post-operative Plan:   Informed Consent:   Plan Discussed with:   Anesthesia Plan Comments:         Anesthesia Quick Evaluation

## 2021-03-04 NOTE — ED Notes (Addendum)
Pt moved from [facility] to [facility]. GI/Endoscopy Dr. Adela Lank MD speaking with pt at [facility]. Pt alert, NAD, calm, interactive, resps e/u, speaking in clear complete sentences.

## 2021-03-04 NOTE — Discharge Instructions (Addendum)
Mr. Mccamy, it was a pleasure taking care of you. We did a very thorough examination including X-ray, CT, and an endoscopy. There was no evidence of glass or injury from your mouth down to your stomach. There is a small possibility that a piece of glass could have worked its way further down. Please return to the ED if you have extreme abdominal pain, feel very dizzy or lightheaded, or find blood in your stool.   The CT of your chest did demonstrate some small nodules. It is unclear what they are at this time. Since you are a smoker, it is recommended that you follow up with a repeat non-contrast CT scan in 12 months.   YOU HAD AN ENDOSCOPIC PROCEDURE TODAY: Refer to the procedure report and other information in the discharge instructions given to you for any specific questions about what was found during the examination. If this information does not answer your questions, please call [place] office at [phone] to clarify.   YOU SHOULD EXPECT: Some feelings of bloating in the abdomen. Passage of more gas than usual. Walking can help get rid of the air that was put into your GI tract during the procedure and reduce the bloating. If you had a lower endoscopy (such as a colonoscopy or flexible sigmoidoscopy) you may notice spotting of blood in your stool or on the toilet paper. Some abdominal soreness may be present for a day or two, also.  DIET: Your first meal following the procedure should be a light meal and then it is ok to progress to your normal diet. A half-sandwich or bowl of soup is an example of a good first meal. Heavy or fried foods are harder to digest and may make you feel nauseous or bloated. Drink plenty of fluids but you should avoid alcoholic beverages for 24 hours. If you had a esophageal dilation, please see attached instructions for diet.    ACTIVITY: Your care partner should take you home directly after the procedure. You should plan to take it easy, moving slowly for the rest of  the day. You can resume normal activity the day after the procedure however YOU SHOULD NOT DRIVE, use power tools, machinery or perform tasks that involve climbing or major physical exertion for 24 hours (because of the sedation medicines used during the test).   SYMPTOMS TO REPORT IMMEDIATELY: A gastroenterologist can be reached at any hour. Please call [phone]  for any of the following symptoms:   Following upper endoscopy (EGD, EUS, ERCP, esophageal dilation) Vomiting of blood or coffee ground material  New, significant abdominal pain  New, significant chest pain or pain under the shoulder blades  Painful or persistently difficult swallowing  New shortness of breath  Black, tarry-looking or red, bloody stools  FOLLOW UP:  If any biopsies were taken you will be contacted by phone or by letter within the next 1-3 weeks. Call [phone]  if you have not heard about the biopsies in 3 weeks.  Please also call with any specific questions about appointments or follow up tests.

## 2021-03-05 ENCOUNTER — Encounter (HOSPITAL_COMMUNITY): Payer: Self-pay | Admitting: Gastroenterology

## 2021-07-01 ENCOUNTER — Encounter: Payer: 59 | Admitting: Nurse Practitioner

## 2021-11-30 ENCOUNTER — Emergency Department (HOSPITAL_COMMUNITY)
Admission: EM | Admit: 2021-11-30 | Discharge: 2021-11-30 | Disposition: A | Discharge status: Home / Self Care | Payer: Self-pay | Attending: Emergency Medicine | Admitting: Emergency Medicine

## 2021-11-30 ENCOUNTER — Emergency Department (HOSPITAL_COMMUNITY): Payer: Self-pay

## 2021-11-30 ENCOUNTER — Other Ambulatory Visit: Payer: Self-pay

## 2021-11-30 ENCOUNTER — Telehealth: Payer: Medicaid Other

## 2021-11-30 DIAGNOSIS — Z20822 Contact with and (suspected) exposure to covid-19: Secondary | ICD-10-CM | POA: Insufficient documentation

## 2021-11-30 DIAGNOSIS — F17210 Nicotine dependence, cigarettes, uncomplicated: Secondary | ICD-10-CM | POA: Insufficient documentation

## 2021-11-30 DIAGNOSIS — J020 Streptococcal pharyngitis: Secondary | ICD-10-CM | POA: Insufficient documentation

## 2021-11-30 DIAGNOSIS — Z79899 Other long term (current) drug therapy: Secondary | ICD-10-CM | POA: Insufficient documentation

## 2021-11-30 LAB — COMPREHENSIVE METABOLIC PANEL
ALT: 20 U/L (ref 0–44)
AST: 22 U/L (ref 15–41)
Albumin: 4 g/dL (ref 3.5–5.0)
Alkaline Phosphatase: 54 U/L (ref 38–126)
Anion gap: 10 (ref 5–15)
BUN: 8 mg/dL (ref 6–20)
CO2: 20 mmol/L — ABNORMAL LOW (ref 22–32)
Calcium: 9 mg/dL (ref 8.9–10.3)
Chloride: 103 mmol/L (ref 98–111)
Creatinine, Ser: 1.07 mg/dL (ref 0.61–1.24)
GFR, Estimated: >60 mL/min (ref >60)
Glucose, Bld: 100 mg/dL — ABNORMAL HIGH (ref 70–99)
Potassium: 3.3 mmol/L — ABNORMAL LOW (ref 3.5–5.1)
Sodium: 133 mmol/L — ABNORMAL LOW (ref 135–145)
Total Bilirubin: 1 mg/dL (ref 0.3–1.2)
Total Protein: 7.2 g/dL (ref 6.5–8.1)

## 2021-11-30 LAB — CBC WITH DIFFERENTIAL/PLATELET
Abs Immature Granulocytes: 0.05 10*3/uL (ref 0.00–0.07)
Basophils Absolute: 0 10*3/uL (ref 0.0–0.1)
Basophils Relative: 0 %
Eosinophils Absolute: 0 10*3/uL (ref 0.0–0.5)
Eosinophils Relative: 0 %
HCT: 40.8 % (ref 39.0–52.0)
Hemoglobin: 13.2 g/dL (ref 13.0–17.0)
Immature Granulocytes: 1 %
Lymphocytes Relative: 12 %
Lymphs Abs: 1.4 10*3/uL (ref 0.7–4.0)
MCH: 23.7 pg — ABNORMAL LOW (ref 26.0–34.0)
MCHC: 32.4 g/dL (ref 30.0–36.0)
MCV: 73.1 fL — ABNORMAL LOW (ref 80.0–100.0)
Monocytes Absolute: 0.9 10*3/uL (ref 0.1–1.0)
Monocytes Relative: 8 %
Neutro Abs: 8.7 10*3/uL — ABNORMAL HIGH (ref 1.7–7.7)
Neutrophils Relative %: 79 %
Platelets: 185 10*3/uL (ref 150–400)
RBC: 5.58 MIL/uL (ref 4.22–5.81)
RDW: 15 % (ref 11.5–15.5)
WBC: 11.1 10*3/uL — ABNORMAL HIGH (ref 4.0–10.5)
nRBC: 0 % (ref 0.0–0.2)

## 2021-11-30 LAB — URINALYSIS, ROUTINE W REFLEX MICROSCOPIC
Bilirubin Urine: NEGATIVE
Glucose, UA: NEGATIVE mg/dL
Hgb urine dipstick: NEGATIVE
Ketones, ur: NEGATIVE mg/dL
Leukocytes,Ua: NEGATIVE
Nitrite: NEGATIVE
Protein, ur: NEGATIVE mg/dL
Specific Gravity, Urine: 1.014 (ref 1.005–1.030)
pH: 7 (ref 5.0–8.0)

## 2021-11-30 LAB — RAPID URINE DRUG SCREEN, HOSP PERFORMED
Amphetamines: NOT DETECTED
Barbiturates: NOT DETECTED
Benzodiazepines: NOT DETECTED
Cocaine: NOT DETECTED
Opiates: NOT DETECTED
Tetrahydrocannabinol: POSITIVE — AB

## 2021-11-30 LAB — RESP PANEL BY RT-PCR (FLU A&B, COVID) ARPGX2
Influenza A by PCR: NEGATIVE
Influenza B by PCR: NEGATIVE
SARS Coronavirus 2 by RT PCR: NEGATIVE

## 2021-11-30 LAB — GROUP A STREP BY PCR: Group A Strep by PCR: DETECTED — AB

## 2021-11-30 MED ORDER — PENICILLIN G BENZATHINE 1200000 UNIT/2ML IM SUSY
1.2000 10*6.[IU] | PREFILLED_SYRINGE | Freq: Once | INTRAMUSCULAR | Status: AC
  Administered 2021-11-30: 1.2 10*6.[IU] via INTRAMUSCULAR
  Filled 2021-11-30: qty 2

## 2021-11-30 MED ORDER — KETOROLAC TROMETHAMINE 30 MG/ML IJ SOLN
15.0000 mg | Freq: Once | INTRAMUSCULAR | Status: AC
  Administered 2021-11-30: 15 mg via INTRAVENOUS
  Filled 2021-11-30: qty 1

## 2021-11-30 MED ORDER — ONDANSETRON HCL 4 MG/2ML IJ SOLN
4.0000 mg | Freq: Once | INTRAMUSCULAR | Status: AC
  Administered 2021-11-30: 4 mg via INTRAVENOUS
  Filled 2021-11-30: qty 2

## 2021-11-30 MED ORDER — SODIUM CHLORIDE 0.9 % IV BOLUS
1000.0000 mL | Freq: Once | INTRAVENOUS | Status: AC
  Administered 2021-11-30: 1000 mL via INTRAVENOUS

## 2021-11-30 MED ORDER — ALBUTEROL SULFATE HFA 108 (90 BASE) MCG/ACT IN AERS
2.0000 | INHALATION_SPRAY | Freq: Four times a day (QID) | RESPIRATORY_TRACT | Status: DC
  Filled 2021-11-30: qty 6.7

## 2021-11-30 MED ORDER — ACETAMINOPHEN 500 MG PO TABS
1000.0000 mg | ORAL_TABLET | Freq: Once | ORAL | Status: AC
  Administered 2021-11-30: 1000 mg via ORAL
  Filled 2021-11-30: qty 2

## 2021-11-30 NOTE — ED Provider Triage Note (Signed)
Emergency Medicine Provider Triage Evaluation Note ? ?Adam Lara , a 41 y.o. tearful male was evaluated in triage.  Pt complains of sudden onset of generalized body aches and weakness, fever, coughing, diplopia, dizziness, abdominal pain, and shortness of breath as of last night around 7 PM.  Symptoms have worsened since then.  Has not taken anything to help.  Tried calling PCP, was told to come to the ED.  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  Hx of EBV and CMV. ? ?Review of Systems  ?Positive: As above ?Negative: As above ? ?Physical Exam  ?BP 128/78   Pulse 97   Temp (!) 102.9 ?F (39.4 ?C) (Oral)   Resp 20   SpO2 100%  ?Gen:   Awake, no distress, tearful ?Resp:  Normal effort, tachypneic, CTAB ?MSK:   Moves extremities without difficulty ?Other:  Normal EOMs, PERRL bilaterally, febrile, HR of 110, respirations 24-26. Global tenderness to abdomen, extremities, chest ? ?Medical Decision Making  ?Medically screening exam initiated at 5:23 PM.  Appropriate orders placed.  Henning Rupard was informed that the remainder of the evaluation will be completed by another provider, this initial triage assessment does not replace that evaluation, and the importance of remaining in the ED until their evaluation is complete. ? ?Labs, imaging, EKG ? ?Tylenol given ?  ?Prince Rome, PA-C ?[id number] ? ?

## 2021-11-30 NOTE — Discharge Instructions (Addendum)
As discussed, your evaluation today has been largely reassuring.  But, it is important that you monitor your condition carefully, and do not hesitate to return to the ED if you develop new, or concerning changes in your condition. ? ?Otherwise, please follow-up with your physician for appropriate ongoing care. ? ?

## 2021-11-30 NOTE — ED Provider Notes (Signed)
?[facility] EMERGENCY DEPARTMENT ?Provider Note ? ? ?CSN: [id number] ?Arrival date & time: 11/30/21  1518 ? ?  ? ?History ? ?Chief Complaint  ?Patient presents with  ? Fever  ? ? ?Adam Lara is a 41 y.o. male. ? ?HPI ?Patient presents with male companion who assists with the history. ?He was well until the past day or so.  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 ?He was not wearing a mask, was in the location for about 24 hours. ?He complains of fever, nausea, weakness, fatigue.  He does smoke cigarettes, though he is trying to stop. ?  ? ?Home Medications ?Prior to Admission medications   ?Medication Sig Start Date End Date Taking? Authorizing Provider  ?albuterol (VENTOLIN HFA) 108 (90 Base) MCG/ACT inhaler Inhale 2 puffs into the lungs every 6 (six) hours as needed for wheezing or shortness of breath. 10/08/20   Arnette Felts, [name]  ?nicotine (NICODERM CQ - DOSED IN MG/24 HOURS) 21 mg/24hr patch Place 1 patch (21 mg total) onto the skin daily. 02/08/21   Arnette Felts, [name]  ?Vitamin D, Ergocalciferol, (DRISDOL) 1.25 MG (50000 UNIT) CAPS capsule Take 1 capsule (50,000 Units total) by mouth every 7 (seven) days. 02/09/21   Arnette Felts, [name]  ?   ? ?Allergies    ?Patient has no known allergies.   ? ?Review of Systems   ?Review of Systems  ?Constitutional:   ?     Per HPI, otherwise negative  ?HENT:    ?     Per HPI, otherwise negative  ?Respiratory:    ?     Per HPI, otherwise negative  ?Cardiovascular:   ?     Per HPI, otherwise negative  ?Gastrointestinal:  Positive for nausea. Negative for vomiting.  ?Endocrine:  ?     Negative aside from HPI  ?Genitourinary:   ?     Neg aside from HPI   ?Musculoskeletal:   ?     Per HPI, otherwise negative  ?Skin: Negative.   ?Neurological:  Negative for syncope.  ? ?Physical Exam ?Updated Vital Signs ?BP 130/76 (BP Location: Right Arm)   Pulse 86    Temp 99.5 ?F (37.5 ?C) (Oral)   Resp 16   SpO2 100%  ?Physical Exam ?Vitals and nursing note reviewed.  ?Constitutional:   ?   General: He is not in acute distress. ?   Appearance: He is well-developed.  ?HENT:  ?   Head: Normocephalic and atraumatic.  ?Eyes:  ?   Conjunctiva/sclera: Conjunctivae normal.  ?Cardiovascular:  ?   Rate and Rhythm: Normal rate and regular rhythm.  ?Pulmonary:  ?   Effort: Pulmonary effort is normal. No respiratory distress.  ?   Breath sounds: No stridor.  ?Abdominal:  ?   General: There is no distension.  ?Skin: ?   General: Skin is warm and dry.  ?Neurological:  ?   Mental Status: He is alert and oriented to person, place, and time.  ? ? ?ED Results / Procedures / Treatments   ?Labs ?(all labs ordered are listed, but only abnormal results are displayed) ?Labs Reviewed  ?COMPREHENSIVE METABOLIC PANEL - Abnormal; Notable for the following components:  ?    Result Value  ? Sodium 133 (*)   ? Potassium 3.3 (*)   ? CO2 20 (*)   ? Glucose, Bld 100 (*)   ?  All other components within normal limits  ?CBC WITH DIFFERENTIAL/PLATELET - Abnormal; Notable for the following components:  ? WBC 11.1 (*)   ? MCV 73.1 (*)   ? MCH 23.7 (*)   ? Neutro Abs 8.7 (*)   ? All other components within normal limits  ?RESP PANEL BY RT-PCR (FLU A&B, COVID) [id number]  ?CULTURE, BLOOD (ROUTINE X 2)  ?CULTURE, BLOOD (ROUTINE X 2)  ?GROUP A STREP BY PCR  ?URINALYSIS, ROUTINE W REFLEX MICROSCOPIC  ?RAPID URINE DRUG SCREEN, HOSP PERFORMED  ? ? ?EKG ?EKG Interpretation ? ?Date/Time:  Tuesday November 30 2021 17:44:01 EST ?Ventricular Rate:  94 ?PR Interval:  172 ?QRS Duration: 80 ?QT Interval:  328 ?QTC Calculation: 410 ?R Axis:   65 ?Text Interpretation: Normal sinus rhythm Nonspecific T wave abnormality Abnormal ECG Confirmed by Gerhard Munch [phone]) on 11/30/2021 8:45:06 PM ? ?Radiology ?DG Chest 2 View ? ?Result Date: 11/30/2021 ?CLINICAL DATA:  Cough, shortness of breath EXAM: CHEST - 2 VIEW COMPARISON:  Chest  radiograph dated October 10, 2020 FINDINGS: The heart size and mediastinal contours are within normal limits. Low lung volumes without evidence of focal consolidation or pleural effusion. The visualized skeletal structures are unremarkable. IMPRESSION: No active cardiopulmonary disease. Electronically Signed   By: Larose Hires D.O.   On: 11/30/2021 18:21   ? ?Procedures ?Procedures  ? ? ?Medications Ordered in ED ?Medications  ?sodium chloride 0.9 % bolus 1,000 mL (has no administration in time range)  ?ketorolac (TORADOL) 30 MG/ML injection 15 mg (has no administration in time range)  ?ondansetron (ZOFRAN) injection 4 mg (has no administration in time range)  ?acetaminophen (TYLENOL) tablet 1,000 mg (1,000 mg Oral Given 11/30/21 1700)  ? ? ?ED Course/ Medical Decision Making/ A&P ? This patient presents to the ED for concern of fever, cough, fatigue, this involves an extensive number of treatment options, and is a complaint that carries with it a high risk of complications and morbidity.  The differential diagnosis includes pneumonia, COVID, influenza, bacteremia, sepsis, environmental exposure secondary to occupation ? ? ?Co morbidities that complicate the patient evaluation ? ?Smoking ? ? ?Social Determinants of Health: ? ?Smoking ? ? ?Additional history obtained: ? ?Additional history and/or information obtained from male companion ?External records from outside source obtained and reviewed including history as above ? ? ?After the initial evaluation, orders, including: X-ray, labs, fluid resuscitation were initiated. ? ?Patient placed on Cardiac and Pulse-Oximetry Monitors. ?The patient was maintained on a cardiac monitor.  The cardiac monitored showed an rhythm of 80 sinus normal ?The patient was also maintained on pulse oximetry. The readings were typically 99% room air normal ? ?On repeat evaluation of the patient improved ? ?Lab Tests: ? ?I personally interpreted labs.  The pertinent results include:  Positive strep negative COVID, influenza, positive THC ? ?Imaging Studies ordered: ? ?I independently visualized and interpreted imaging which showed negative chest x-ray, no pneumonia ?I agree with the radiologist interpretation ? ? ? ?Dispostion / Final MDM: ? ?After consideration of the diagnostic results and the patient's response to treatment, he is feeling better, having received fluids, Toradol, antiemetics, and on repeat exam exudate is visible on both tonsillar surfaces no asymmetry, no erythema.  No evidence for bacteremia, sepsis, with this explanation for the patient's fever, fatigue, given his improvement here he is appropriate for discharge after receiving IM penicillin.  With his girlfriend we discussed possibilities of the black mold exposure contributing to his symptoms, though this is less likely given the  alternative diagnosis.  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 ? ?Final Clinical Impression(s) / ED Diagnoses ?Final diagnoses:  ?Strep pharyngitis  ? ?  ?Gerhard Munch, MD ?11/30/21 2324 ? ?

## 2021-11-30 NOTE — ED Notes (Signed)
Pt verbalized understanding of d/c instructions, meds, and followup care. Denies questions. VSS, no distress noted. Steady gait to exit with all belongings.  ?

## 2021-11-30 NOTE — ED Triage Notes (Signed)
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 Last night developed a fever, generalized body aches, and "coughed up something black." Febrile in triage. Also would like eval of possibly infected finger, small area of broken skin to  L hand.  ?

## 2021-12-01 ENCOUNTER — Telehealth: Payer: Self-pay

## 2021-12-01 ENCOUNTER — Other Ambulatory Visit: Payer: Self-pay | Admitting: Nurse Practitioner

## 2021-12-01 DIAGNOSIS — Z716 Tobacco abuse counseling: Secondary | ICD-10-CM

## 2021-12-01 NOTE — ED Notes (Signed)
Called pt for vitals, no answer 

## 2021-12-01 NOTE — Telephone Encounter (Signed)
Transition Care Management Unsuccessful Follow-up Telephone Call ? ?Date of discharge and from where:  12/01/2021 [facility]  ? ?Attempts:  1st Attempt ? ?Reason for unsuccessful TCM follow-up call:  Left voice message ? ? ? ?

## 2021-12-05 LAB — CULTURE, BLOOD (ROUTINE X 2)
Culture: NO GROWTH
Culture: NO GROWTH
Special Requests: ADEQUATE

## 2022-01-11 ENCOUNTER — Encounter: Payer: Self-pay | Admitting: Nurse Practitioner

## 2022-04-09 IMAGING — CT CT NECK W/O CM
5 of 6 series · 14 of 33 positions shown, 16 images · IV contrast (APPLIED)
Comparison: Prior radiograph from earlier the same day.

CLINICAL DATA: Initial evaluation for possible foreign body.

EXAM:
CT NECK WITHOUT CONTRAST
TECHNIQUE: Multidetector CT imaging of the neck was performed following the
standard protocol without intravenous contrast.

[Series 3: axial neck · axial · 0.49mm/px · z∈[+1138,+1232]mm · 2 of 143 slices shown, 3 images]
[im 48/143  soft-tissue]
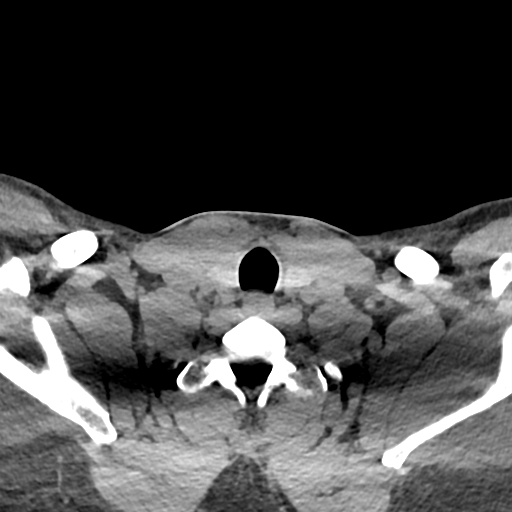
[im 48/143  bone]
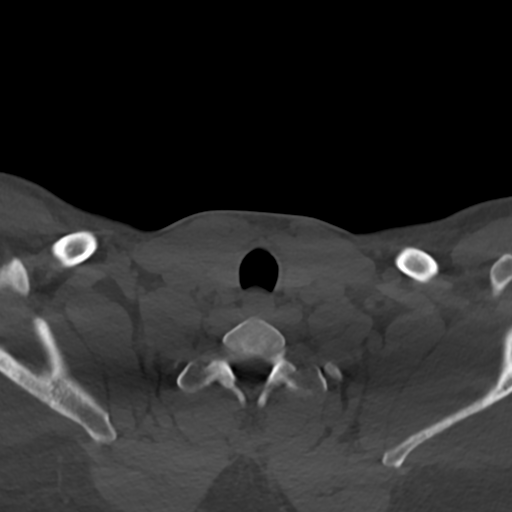
[im 95/143  bone]
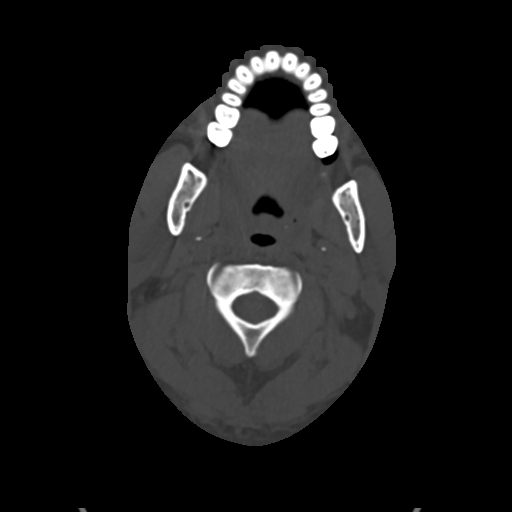

[Series 5: axial bone · axial · 0.49mm/px · z∈[+1138,+1232]mm · 2 of 143 slices shown]
[im 48/143  bone]
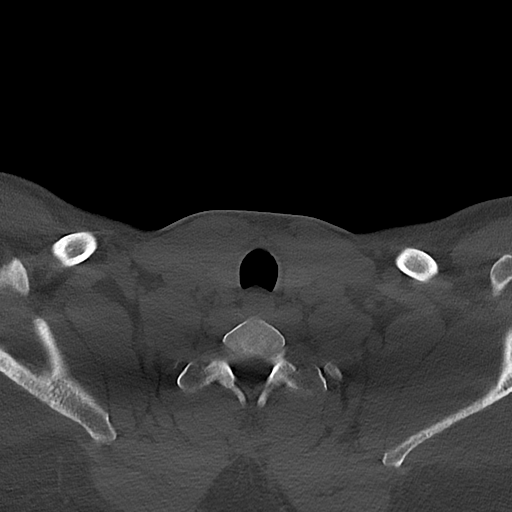
[im 95/143  bone]
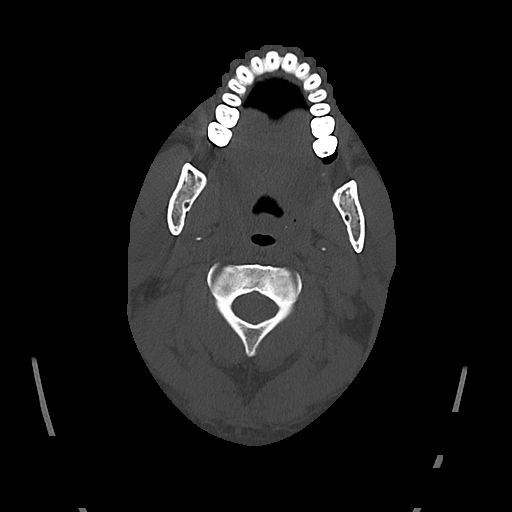

[Series 6: sag neck · sagittal · 0.54mm/px · 5 of 250 slices shown, 6 images]
[im 84/250  bone]
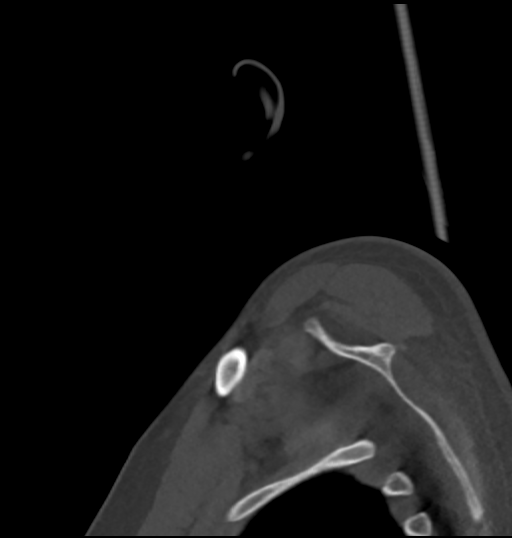
[im 104/250  bone]
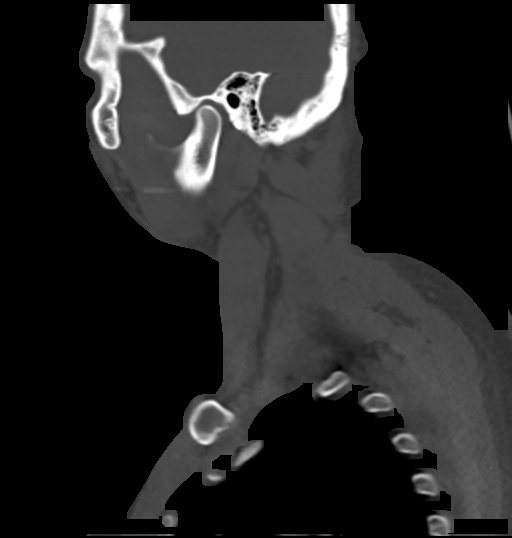
[im 125/250  soft-tissue]
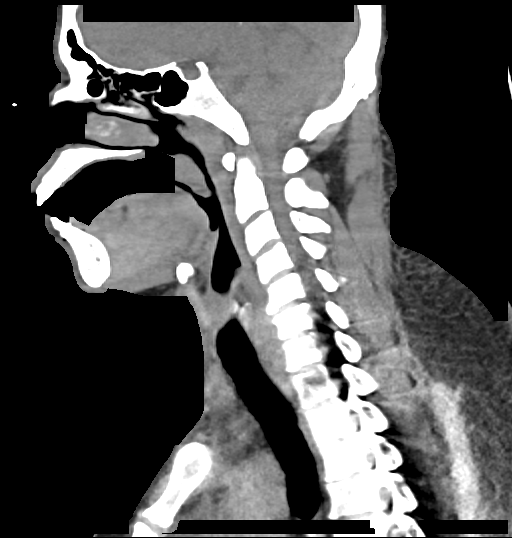
[im 125/250  bone]
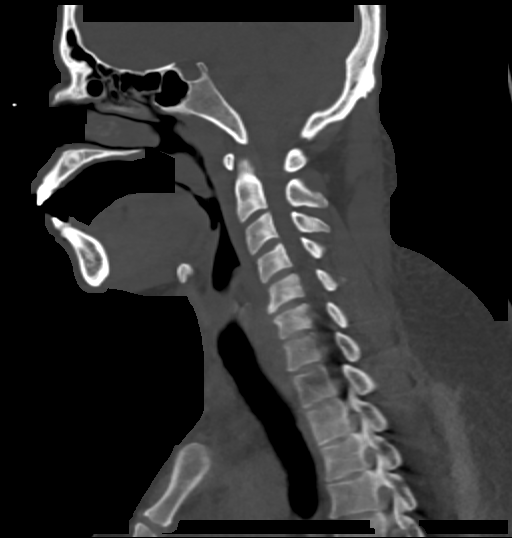
[im 146/250  bone]
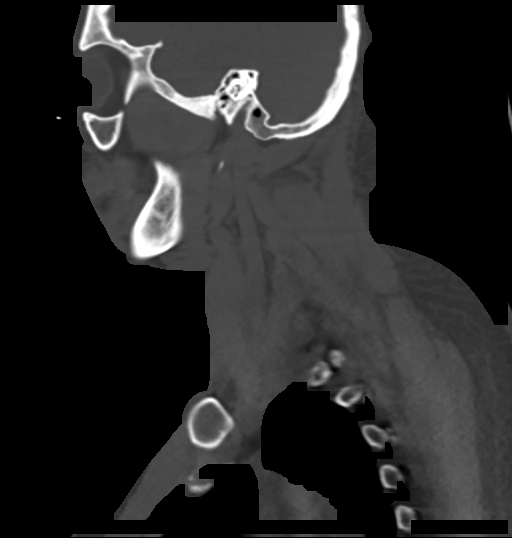
[im 167/250  bone]
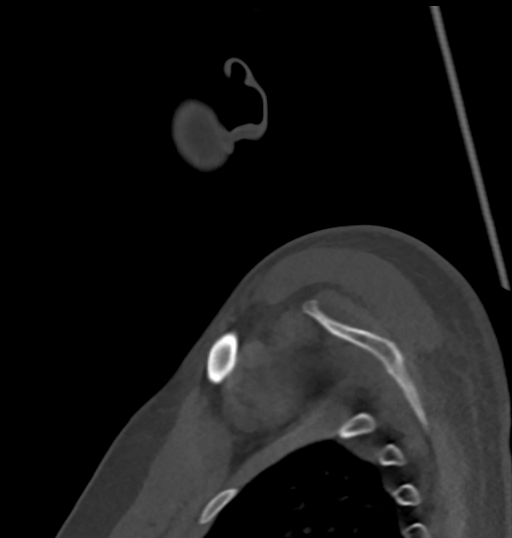

[Series 7: cor neck · coronal · 0.52mm/px · 3 of 150 slices shown]
[im 30/150  bone]
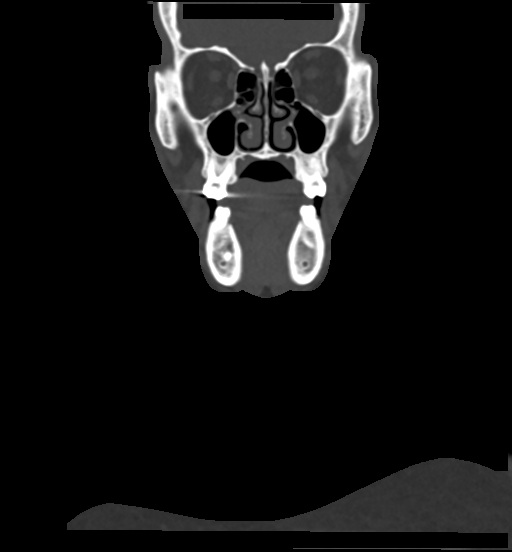
[im 60/150  bone]
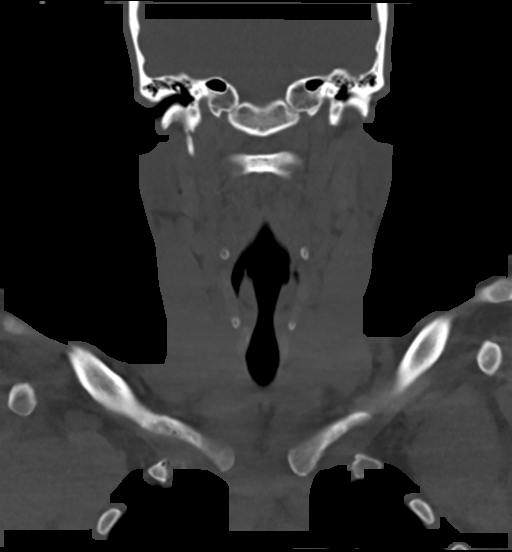
[im 90/150  bone]
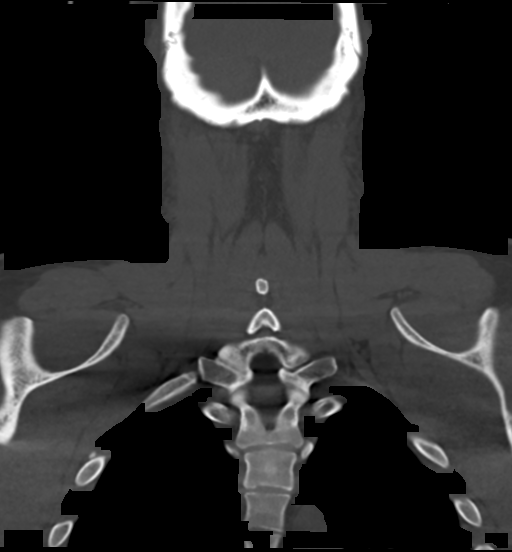

[Series 8: ax oropharynx · axial · 0.52mm/px · z∈[+1139,+1235]mm · 2 of 145 slices shown]
[im 49/145  bone]
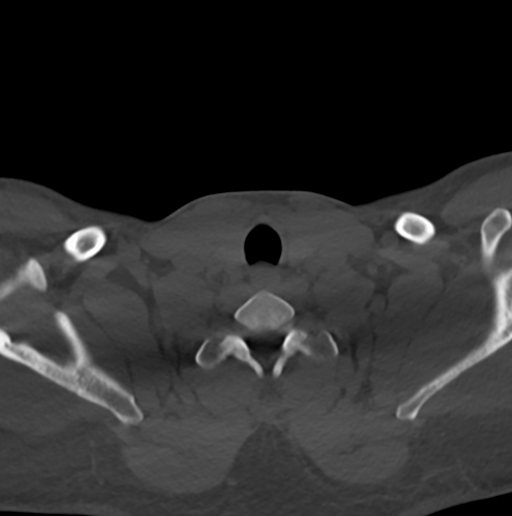
[im 97/145  bone]
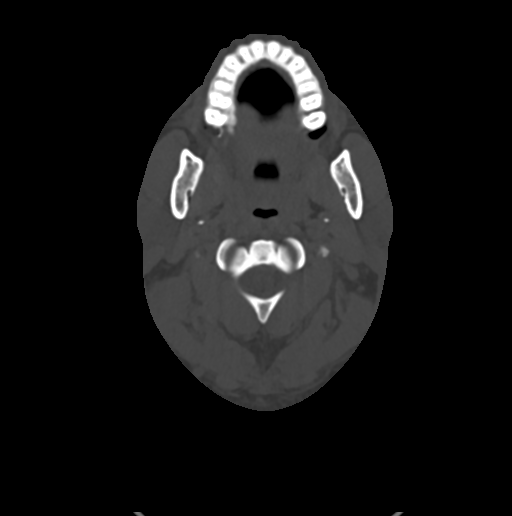

[14 of 33 positions shown; findings below may reference images not displayed]

FINDINGS: Pharynx and larynx: Oral cavity within normal limits. No acute
abnormality about the dentition. Punctate calcified tonsillith noted
at the left palatine tonsil. Tonsils otherwise unremarkable.
Remainder of the oropharynx and nasopharynx within normal limits. No
retropharyngeal collection or swelling. Epiglottis normal. Vallecula
largely clear. Remainder of the hypopharynx and supraglottic larynx
normal. Subglottic airway patent and clear. Visualized upper
esophagus unremarkable. No visible retained foreign body.

Salivary glands: Salivary glands including the parotid and
submandibular glands are normal.

Thyroid: Normal.

Lymph nodes: No enlarged or pathologic adenopathy within the neck.

Vascular: Evaluation of the vascular structures limited by lack of
IV contrast.

Limited intracranial: Unremarkable.

Visualized orbits: Unremarkable.

Mastoids and visualized paranasal sinuses: Paranasal sinuses are
largely clear. Mastoid air cells and middle ear cavities are well
pneumatized and free of fluid.

Skeleton: No acute osseous finding. No discrete or worrisome osseous
lesions.

Upper chest: Visualized upper chest demonstrates no acute finding,
although better evaluated on concomitant CT of the chest.

Other: None.
IMPRESSION: Negative CT of the neck. No visible retained foreign body
identified.

## 2023-04-17 ENCOUNTER — Emergency Department (HOSPITAL_COMMUNITY)
Admission: EM | Admit: 2023-04-17 | Discharge: 2023-04-18 | Disposition: A | Discharge status: Home / Self Care | Payer: Medicaid Other | Attending: Emergency Medicine | Admitting: Emergency Medicine

## 2023-04-17 ENCOUNTER — Encounter (HOSPITAL_COMMUNITY): Payer: Self-pay

## 2023-04-17 ENCOUNTER — Other Ambulatory Visit: Payer: Self-pay

## 2023-04-17 ENCOUNTER — Ambulatory Visit (HOSPITAL_COMMUNITY)
Admission: EM | Admit: 2023-04-17 | Discharge: 2023-04-17 | Disposition: A | Discharge status: Home / Self Care | Payer: Medicaid Other | Attending: Family Medicine | Admitting: Family Medicine

## 2023-04-17 DIAGNOSIS — N4889 Other specified disorders of penis: Secondary | ICD-10-CM

## 2023-04-17 DIAGNOSIS — X58XXXA Exposure to other specified factors, initial encounter: Secondary | ICD-10-CM | POA: Insufficient documentation

## 2023-04-17 DIAGNOSIS — S3993XA Unspecified injury of pelvis, initial encounter: Secondary | ICD-10-CM | POA: Diagnosis present

## 2023-04-17 DIAGNOSIS — S3021XA Contusion of penis, initial encounter: Secondary | ICD-10-CM | POA: Insufficient documentation

## 2023-04-17 LAB — URINALYSIS, ROUTINE W REFLEX MICROSCOPIC
Bilirubin Urine: NEGATIVE
Glucose, UA: NEGATIVE mg/dL
Hgb urine dipstick: NEGATIVE
Ketones, ur: NEGATIVE mg/dL
Leukocytes,Ua: NEGATIVE
Nitrite: NEGATIVE
Protein, ur: NEGATIVE mg/dL
Specific Gravity, Urine: 1.028 (ref 1.005–1.030)
pH: 5 (ref 5.0–8.0)

## 2023-04-17 NOTE — ED Triage Notes (Signed)
Pt presents to office for penis pain. Pt stated he was having sexual intercourse this morning. when he felt a vein pop in his penis.

## 2023-04-17 NOTE — ED Triage Notes (Signed)
Pt sent by urgent care for further evaluation for possible penile fracture; pt states he was having  sexual intercourse this am, felt a vein pop in his penis; pt endorses pain currently, swelling and bruising to penis

## 2023-04-17 NOTE — ED Provider Notes (Signed)
[facility]    CSN: [id number] Arrival date & time: 04/17/23  1624      History   Chief Complaint Chief Complaint  Patient presents with   Penis Injury    HPI Adam Lara is a 42 y.o. male.    Penis Injury  Here for pain and swelling of his penis. Early this morning during intercourse, he felt a pop/crack and felt immediate pain. 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 Authorizing Provider  albuterol (VENTOLIN HFA) 108 (90 Base) MCG/ACT inhaler Inhale 2 puffs into the lungs every 6 (six) hours as needed for wheezing or shortness of breath. 10/08/20   Arnette Felts, [name]  Cholecalciferol (QC VITAMIN D3) 50 MCG (2000 UT) TABS Take 2,000 Units by mouth daily.    [provider]  nicotine (NICODERM CQ - DOSED IN MG/24 HOURS) 21 mg/24hr patch Place 1 patch (21 mg total) onto the skin daily. Patient not taking: Reported on 11/30/2021 02/08/21   Arnette Felts, [name]  Vitamin D, Ergocalciferol, (DRISDOL) 1.25 MG (50000 UNIT) CAPS capsule Take 1 capsule (50,000 Units total) by mouth every 7 (seven) days. Patient not taking: Reported on 11/30/2021 02/09/21   Arnette Felts, [name]    Family History Family History  Problem Relation Age of Onset   Hypertension Mother    Diabetes Mother    Healthy Sister    Healthy Sister    Healthy Brother    Healthy  Brother    Healthy Brother     Social History Social History   Tobacco Use   Smoking status: Every Day    Current packs/day: 0.50    Average packs/day: 0.5 packs/day for 20.0 years (10.0 ttl pk-yrs)    Types: Cigarettes   Smokeless tobacco: Never   Tobacco comments:    he has cut down to 3-4 cigarettes a day.   Substance Use Topics   Alcohol use: Yes    Comment: OCCASIONALLY   Drug use: Yes    Types: Marijuana     Allergies   Patient has no known allergies.   Review of Systems Review of Systems   Physical Exam Triage Vital Signs ED Triage Vitals [04/17/23 1727]  Encounter Vitals Group     BP (!) 152/94     Systolic BP Percentile      Diastolic BP Percentile      Pulse Rate 80     Resp 16     Temp 99 F (37.2 C)     Temp Source Oral     SpO2 98 %     Weight      Height      Head Circumference      Peak Flow      Pain Score      Pain Loc      Pain Education      Exclude from [employer]  Chart    No data found.  Updated Vital Signs BP (!) 152/94 (BP Location: Left Arm)   Pulse 80   Temp 99 F (37.2 C) (Oral)   Resp 16   SpO2 98%   Visual Acuity Right Eye Distance:   Left Eye Distance:   Bilateral Distance:    Right Eye Near:   Left Eye Near:    Bilateral Near:     Physical Exam Vitals reviewed.  Constitutional:      General: He is not in acute distress.    Appearance: He is not ill-appearing, toxic-appearing or diaphoretic.  Genitourinary:    Comments: The shaft of the penis is mildly swollen. There is a well circumscribed dark spot on the dorsum about 1 cm diameter. There is tenderness of the dorsum of the penis midway Skin:    Coloration: Skin is not pale.  Neurological:     General: No focal deficit present.     Mental Status: He is alert and oriented to person, place, and time.  Psychiatric:        Behavior: Behavior normal.      UC Treatments / Results  Labs (all labs ordered are listed, but only abnormal results are  displayed) Labs Reviewed - No data to display  EKG   Radiology No results found.  Procedures Procedures (including critical care time)  Medications Ordered in UC Medications - No data to display  Initial Impression / Assessment and Plan / UC Course  I have reviewed the triage vital signs and the nursing notes.  Pertinent labs & imaging results that were available during my care of the patient were reviewed by me and considered in my medical decision making (see chart for details).        I have asked him to proceed to the ER for further eval. Discussed that he needs eval for potential fracture of penis. He will go by private car Final Clinical Impressions(s) / UC Diagnoses   Final diagnoses:  Pain in penis     Discharge Instructions      Please go to the ER for further evaluation     ED Prescriptions   None    PDMP not reviewed this encounter.   Zenia Resides, MD 04/17/23 [phone]

## 2023-04-17 NOTE — Discharge Instructions (Signed)
Please go to the ER for further evaluation °

## 2023-04-17 NOTE — ED Provider Notes (Signed)
[place] EMERGENCY DEPARTMENT AT [facility] Provider Note  MDM   HPI/ROS:  Adam Lara is a 42 y.o. male with a medical history as below who presents for evaluation of pain in his penis.  Reports that he was engaging in sexual intercourse earlier this morning, and began experiencing pain on the right side of his penis.  He states that he did feel a popping sensation and had immediately detumescence of his penis.  He noticed a small area of bruising on the right side afterwards and has been having some pain.  He has been able to urinate without significant difficulty, denies any frank hematuria.  No penile blood or discharge.  Only complaining of pain with significant motion and mild tenderness to palpation to the right side of the shaft of the penis.  States he has not had an erection since the pain.  Physical exam is notable for: - Right-sided penile tenderness with contusion, no obvious deformity  On my initial evaluation, patient is:  -Vital signs stable. Patient afebrile, hemodynamically stable, and non-toxic appearing.   This patient's current presentation, including their history and physical exam, is most consistent with penile contusion versus fracture. Differentials include penile fracture, STI, urethral injury, all less likely and not supported by history and physical exam.  Will obtain a urinalysis to evaluate for frank hematuria or microscopic hematuria, reevaluation.  Given patient's story of hearing a pop and feeling detumescence of his penis immediately thereafter, will speak with urology prior to discharge.  Interpretations, interventions, and the patient's course of care are documented below.    Clinical Course as of 04/18/23 0125  Mon Apr 17, 2023  2333 Urinalysis, Routine w reflex microscopic -Urine, Random No evidence of blood in the urine [BB]  Tue Apr 18, 2023  0003 Spoke with urology via telephone [BB]    Clinical Course User Index [BB] Fayrene Helper, MD    After her urinalysis returned without any evidence of microscopic hematuria, I did speak with urology via telephone and the agreed with the plan for discharge with close follow-up.  I gave the patient a phone number for the urologist office and instruction to call him for close follow-up tomorrow after 8 AM.   Disposition:  I discussed the plan for discharge with the patient and/or their surrogate at bedside prior to discharge and they were in agreement with the plan and verbalized understanding of the return precautions provided. All questions answered to the best of my ability. Ultimately, the patient was discharged in stable condition with stable vital signs. I am reassured that they are capable of close follow up and good social support at home.   Clinical Impression:  1. Contusion of penis, initial encounter     Rx / DC Orders ED Discharge Orders          Ordered    HYDROcodone-acetaminophen (NORCO/VICODIN) 5-325 MG tablet  Every 6 hours PRN        04/18/23 0003            The plan for this patient was discussed with Dr. Wallace Cullens, who voiced agreement and who oversaw evaluation and treatment of this patient.   Clinical Complexity A medically appropriate history, review of systems, and physical exam was performed.  My independent interpretations of EKG, labs, and radiology are documented in the ED course above.   If decision rules were used in this patient's evaluation, they are listed below.   Click here for ABCD2, HEART and other  calculatorsREFRESH Note before signing   Patient's presentation is most consistent with acute complicated illness / injury requiring diagnostic workup.  Medical Decision Making Amount and/or Complexity of Data Reviewed External Data Reviewed: notes.    Details: Urgent Care Notes Labs: ordered. Decision-making details documented in ED Course. Discussion of management or test interpretation with external provider(s): Spoke with  urology, Dr. Liliane Shi.  Risk Prescription drug management.    HPI/ROS      See MDM section for pertinent HPI and ROS. A complete ROS was performed with pertinent positives/negatives noted above.   Past Medical History:  Diagnosis Date   Medical history non-contributory     Past Surgical History:  Procedure Laterality Date   ESOPHAGOGASTRODUODENOSCOPY (EGD) WITH PROPOFOL N/A 03/04/2021   Procedure: ESOPHAGOGASTRODUODENOSCOPY (EGD) WITH PROPOFOL;  Surgeon: Benancio Deeds, MD;  Location: [facility] ENDOSCOPY;  Service: Gastroenterology;  Laterality: N/A;      Physical Exam   Vitals:   04/17/23 1838 04/17/23 1839 04/17/23 2146  BP:  (!) 131/96 (!) 147/106  Pulse:  79 77  Resp:  18 16  Temp: 98.2 F (36.8 C)  99 F (37.2 C)  TempSrc: Oral  Oral  SpO2:  100% 100%    Physical Exam Vitals and nursing note reviewed. Exam conducted with a chaperone present.  Constitutional:      General: He is not in acute distress.    Appearance: He is well-developed.  HENT:     Head: Normocephalic and atraumatic.  Eyes:     Conjunctiva/sclera: Conjunctivae normal.  Cardiovascular:     Rate and Rhythm: Normal rate and regular rhythm.     Heart sounds: No murmur heard. Pulmonary:     Effort: Pulmonary effort is normal. No respiratory distress.     Breath sounds: Normal breath sounds.  Abdominal:     Palpations: Abdomen is soft.     Tenderness: There is no abdominal tenderness.  Genitourinary:    Penis: Uncircumcised. Tenderness (To the right side of the penis, with some ecchymosis) present. No phimosis, paraphimosis, erythema or lesions.      Testes: Normal. Cremasteric reflex is present.  Musculoskeletal:        General: No swelling.     Cervical back: Neck supple.  Skin:    General: Skin is warm and dry.     Capillary Refill: Capillary refill takes less than 2 seconds.  Neurological:     Mental Status: He is alert.  Psychiatric:        Mood and Affect: Mood normal.       Procedures   If procedures were preformed on this patient, they are listed below:  Procedures   Fayrene Helper, MD Emergency Medicine PGY-2   Please note that this documentation was produced with the assistance of voice-to-text technology and may contain errors.    Fayrene Helper, MD 04/18/23 0131    Franne Forts, DO 04/28/23 1334

## 2023-04-17 NOTE — ED Notes (Signed)
Patient states he had intercourse this am and felt something pop. Reports tender on palpation.

## 2023-04-18 ENCOUNTER — Telehealth: Payer: Self-pay

## 2023-04-18 MED ORDER — HYDROCODONE-ACETAMINOPHEN 5-325 MG PO TABS: 2.0000 | ORAL_TABLET | Freq: Four times a day (QID) | ORAL | 0 refills | Status: DC | PRN

## 2023-04-18 NOTE — Discharge Instructions (Addendum)
You were seen today for evaluation of the penile injury. While you were here we monitored your vitals, preformed a physical exam, and checked your urine for blood. These were all reassuring and there is no indication for any further testing or intervention in the emergency department at this time.   Things to do:  - Follow up with your primary care provider within the next 1-2 weeks -Call the urologist office tomorrow, I have provided the phone number for the urologist that is on-call and you need to call and ask for a work in appointment tomorrow -Do not attempt sex or sexual intercourse until you are able to see the urologist  Return to the emergency department if you have any new or worsening symptoms including swelling, continued pain, or if you have any other concerns.

## 2023-04-18 NOTE — Transitions of Care (Post Inpatient/ED Visit) (Signed)
   04/18/2023  Name: Adam Lara MRN: [id number] DOB: 07/14/1981  Today's TOC FU Call Status: Today's TOC FU Call Status:: Unsuccessul Call (1st Attempt) Unsuccessful Call (1st Attempt) Date: 04/18/23  Attempted to reach the patient regarding the most recent Inpatient/ED visit.  Follow Up Plan: Additional outreach attempts will be made to reach the patient to complete the Transitions of Care (Post Inpatient/ED visit) call.   Signature Lisabeth Devoid, [place]

## 2023-04-24 ENCOUNTER — Other Ambulatory Visit: Payer: Self-pay | Admitting: Nurse Practitioner

## 2023-04-24 ENCOUNTER — Telehealth: Payer: Self-pay

## 2023-04-24 NOTE — Transitions of Care (Post Inpatient/ED Visit) (Signed)
   04/24/2023  Name: Adam Lara MRN: [id number] DOB: Jul 20, 1981  Today's TOC FU Call Status: Today's TOC FU Call Status:: Successful TOC FU Call Competed TOC FU Call Complete Date: 04/24/23  Transition Care Management Follow-up Telephone Call Date of Discharge: 04/18/23 Discharge Facility: [facility]) Type of Discharge: Emergency Department How have you been since you were released from the hospital?: Better Any questions or concerns?: No  Items Reviewed: Did you receive and understand the discharge instructions provided?: Yes Medications obtained,verified, and reconciled?: Yes (Medications Reviewed) Any new allergies since your discharge?: Yes Dietary orders reviewed?: Yes Do you have support at home?: Yes People in Home: spouse  Medications Reviewed Today: Medications Reviewed Today     Reviewed by Marlyn Corporal, CMA (Certified Medical Assistant) on 04/24/23 at 1256  Med List Status: <None>   Medication Order Taking? Sig Documenting Provider Last Dose Status Informant  albuterol (VENTOLIN HFA) 108 (90 Base) MCG/ACT inhaler [id number]  Inhale 2 puffs into the lungs every 6 (six) hours as needed for wheezing or shortness of breath. Arnette Felts, [name]  Active Self           Med Note Margo Aye, MISTY D   Tue Nov 30, 2021 10:32 PM) Needs a refill  Cholecalciferol (QC VITAMIN D3) 50 MCG (2000 UT) TABS [id number]  Take 2,000 Units by mouth daily. [provider]  Active Self  HYDROcodone-acetaminophen (NORCO/VICODIN) 5-325 MG tablet [id number]  Take 2 tablets by mouth every 6 (six) hours as needed. Fayrene Helper, MD  Active   nicotine (NICODERM CQ - DOSED IN MG/24 HOURS) 21 mg/24hr patch [id number] No Place 1 patch (21 mg total) onto the skin daily.  Patient not taking: Reported on 11/30/2021   Arnette Felts, [name] Not Taking Active Self  Vitamin D, Ergocalciferol, (DRISDOL) 1.25 MG (50000 UNIT) CAPS capsule [id number] No Take 1 capsule (50,000 Units total) by mouth every 7  (seven) days.  Patient not taking: Reported on 11/30/2021   Arnette Felts, [name] Not Taking Active Self  Med List Note Delories Heinz, CPhT 11/30/21 2234): -            Home Care and Equipment/Supplies: Were Home Health Services Ordered?: NA Has Agency set up a time to come to your home?: No Any new equipment or medical supplies ordered?: No  Functional Questionnaire: Do you need assistance with bathing/showering or dressing?: No Do you need assistance with meal preparation?: No Do you need assistance with eating?: No Do you have difficulty maintaining continence: No Do you need assistance with getting out of bed/getting out of a chair/moving?: No Do you have difficulty managing or taking your medications?: No  Follow up appointments reviewed: PCP Follow-up appointment confirmed?: Yes Specialist Hospital Follow-up appointment confirmed?: No Do you need transportation to your follow-up appointment?: No Do you understand care options if your condition(s) worsen?: Yes-patient verbalized understanding    SIGNATURE Lisabeth Devoid, CMA

## 2023-04-26 ENCOUNTER — Ambulatory Visit: Payer: Medicaid Other | Admitting: Family Medicine

## 2023-04-26 ENCOUNTER — Other Ambulatory Visit: Payer: Self-pay

## 2023-04-26 ENCOUNTER — Encounter: Payer: Self-pay | Admitting: Family Medicine

## 2023-04-26 VITALS — BP 140/84 | HR 88 | Temp 97.8°F | Ht 69.0 in | Wt 253.0 lb

## 2023-04-26 DIAGNOSIS — R059 Cough, unspecified: Secondary | ICD-10-CM

## 2023-04-26 DIAGNOSIS — Z6837 Body mass index (BMI) 37.0-37.9, adult: Secondary | ICD-10-CM

## 2023-04-26 DIAGNOSIS — S3021XD Contusion of penis, subsequent encounter: Secondary | ICD-10-CM

## 2023-04-26 DIAGNOSIS — I10 Essential (primary) hypertension: Secondary | ICD-10-CM | POA: Insufficient documentation

## 2023-04-26 DIAGNOSIS — E6609 Other obesity due to excess calories: Secondary | ICD-10-CM | POA: Diagnosis not present

## 2023-04-26 DIAGNOSIS — K08129 Complete loss of teeth due to periodontal diseases, unspecified class: Secondary | ICD-10-CM

## 2023-04-26 DIAGNOSIS — S3021XA Contusion of penis, initial encounter: Secondary | ICD-10-CM | POA: Insufficient documentation

## 2023-04-26 MED ORDER — AMLODIPINE BESYLATE 5 MG PO TABS: 5.0000 mg | ORAL_TABLET | Freq: Every day | ORAL | 11 refills | Status: DC

## 2023-04-26 MED ORDER — VITAMIN D (ERGOCALCIFEROL) 1.25 MG (50000 UNIT) PO CAPS: 50000.0000 [IU] | ORAL_CAPSULE | ORAL | 1 refills | Status: DC

## 2023-04-26 MED ORDER — ALBUTEROL SULFATE HFA 108 (90 BASE) MCG/ACT IN AERS: 2.0000 | INHALATION_SPRAY | Freq: Four times a day (QID) | RESPIRATORY_TRACT | 2 refills | Status: AC | PRN

## 2023-04-26 NOTE — Assessment & Plan Note (Signed)
Referred to Dentistry

## 2023-04-26 NOTE — Assessment & Plan Note (Signed)
New diagnosis. Start Norvasc

## 2023-04-26 NOTE — Assessment & Plan Note (Signed)
Referred to Urology

## 2023-04-26 NOTE — Progress Notes (Signed)
I,Jameka J Llittleton, CMA,acting as a Neurosurgeon for [employer], NP.,have documented all relevant documentation on the behalf of Adonus Uselman, NP,as directed by  Mandel Seiden Moshe Salisbury, NP while in the presence of Damein Gaunce, NP.  Subjective:  Patient ID: Adam Lara , male    DOB: [birth year] , 42 y.o.   MRN: [id number]  Chief Complaint  Patient presents with   hospital f/u    HPI  Patient presents today for a hospital follow up. He reports he went to the ER on 04/17/2023 for Penile injury he sustained during sex.He reports the pain and swelling is much better today, he was referred to a Urologist in [place] but they never contacted him back. Patient has had several elevated BP readings without a diagnosis of hypertension, will start him on an antihypertensive medication.  Patient has concerns about Dental sensitivity and decay.  BP Readings from Last 3 Encounters: 04/26/23 : (!) 140/84 04/17/23 : (!) 147/106 04/17/23 : Marland Kitchen 152/94       Past Medical History:  Diagnosis Date   Medical history non-contributory      Family History  Problem Relation Age of Onset   Hypertension Mother    Diabetes Mother    Healthy Sister    Healthy Sister    Healthy Brother    Healthy Brother    Healthy Brother      Current Outpatient Medications:    amLODipine (NORVASC) 5 MG tablet, Take 1 tablet (5 mg total) by mouth daily., Disp: 30 tablet, Rfl: 11   albuterol (VENTOLIN HFA) 108 (90 Base) MCG/ACT inhaler, Inhale 2 puffs into the lungs every 6 (six) hours as needed for wheezing or shortness of breath., Disp: 8 g, Rfl: 2   Vitamin D, Ergocalciferol, (DRISDOL) 1.25 MG (50000 UNIT) CAPS capsule, Take 1 capsule (50,000 Units total) by mouth every 7 (seven) days., Disp: 12 capsule, Rfl: 1   No Known Allergies   Review of Systems  Constitutional: Negative.   HENT:  Positive for dental problem.   Eyes: Negative.   Respiratory: Negative.    Endocrine: Negative.   Genitourinary:  Positive for penile  pain and penile swelling. Negative for scrotal swelling.  Neurological: Negative.   Hematological: Negative.      Today's Vitals   04/26/23 0836  BP: (!) 140/84  Pulse: 88  Temp: 97.8 F (36.6 C)  Weight: 253 lb (114.8 kg)  Height: 5\' 9"  (1.753 m)  PainSc: 2    Body mass index is 37.36 kg/m.  Wt Readings from Last 3 Encounters:  04/26/23 253 lb (114.8 kg)  03/04/21 225 lb (102.1 kg)  07/08/20 219 lb (99.3 kg)     Objective:  Physical Exam Cardiovascular:     Rate and Rhythm: Normal rate and regular rhythm.  Pulmonary:     Effort: Pulmonary effort is normal.     Breath sounds: Normal breath sounds.  Musculoskeletal:        General: Normal range of motion.  Skin:    General: Skin is warm and dry.  Neurological:     Mental Status: He is alert.  Psychiatric:        Mood and Affect: Mood normal.         Assessment And Plan:  Contusion of penis, subsequent encounter Assessment & Plan: Referred to Urology  Orders: -     Ambulatory referral to Urology  Primary hypertension Assessment & Plan: New diagnosis. Start Norvasc  Orders: -     amLODIPine Besylate; Take 1 tablet (  5 mg total) by mouth daily.  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  Patient was given opportunity to ask questions. Patient verbalized understanding of the plan and was able to repeat key elements of the plan. All questions were answered to their satisfaction.  Stanislaw Acton Moshe Salisbury, NP  I, Livie Vanderhoof Moshe Salisbury, NP, have reviewed all documentation for this visit. The documentation on 04/26/23 for the exam,  diagnosis, procedures, and orders are all accurate and complete.   IF YOU HAVE BEEN REFERRED TO A SPECIALIST, IT MAY TAKE 1-2 WEEKS TO SCHEDULE/PROCESS THE REFERRAL. IF YOU HAVE NOT HEARD FROM US/SPECIALIST IN TWO WEEKS, PLEASE GIVE [place] A CALL AT [phone].   THE PATIENT IS ENCOURAGED TO PRACTICE SOCIAL DISTANCING DUE TO THE COVID-19 PANDEMIC.

## 2023-05-10 ENCOUNTER — Ambulatory Visit: Payer: Medicaid Other | Admitting: Family Medicine

## 2023-05-10 ENCOUNTER — Ambulatory Visit: Payer: Medicaid Other | Admitting: Nurse Practitioner

## 2023-05-10 ENCOUNTER — Encounter: Payer: Self-pay | Admitting: Family Medicine

## 2023-05-10 VITALS — BP 118/82 | HR 84 | Temp 98.4°F | Ht 69.0 in | Wt 252.0 lb

## 2023-05-10 DIAGNOSIS — R899 Unspecified abnormal finding in specimens from other organs, systems and tissues: Secondary | ICD-10-CM | POA: Diagnosis not present

## 2023-05-10 DIAGNOSIS — I10 Essential (primary) hypertension: Secondary | ICD-10-CM

## 2023-05-10 DIAGNOSIS — Z6837 Body mass index (BMI) 37.0-37.9, adult: Secondary | ICD-10-CM

## 2023-05-10 DIAGNOSIS — K029 Dental caries, unspecified: Secondary | ICD-10-CM

## 2023-05-10 DIAGNOSIS — E559 Vitamin D deficiency, unspecified: Secondary | ICD-10-CM

## 2023-05-10 DIAGNOSIS — E6609 Other obesity due to excess calories: Secondary | ICD-10-CM | POA: Diagnosis not present

## 2023-05-10 DIAGNOSIS — Z0000 Encounter for general adult medical examination without abnormal findings: Secondary | ICD-10-CM | POA: Diagnosis not present

## 2023-05-10 LAB — POCT URINALYSIS DIPSTICK
Blood, UA: NEGATIVE
Glucose, UA: NEGATIVE
Nitrite, UA: NEGATIVE
Protein, UA: POSITIVE — AB
Spec Grav, UA: >=1.03 — AB (ref 1.010–1.025)
Urobilinogen, UA: 4 E.U./dL — AB
pH, UA: 6 (ref 5.0–8.0)

## 2023-05-10 MED ORDER — HYDROCODONE-ACETAMINOPHEN 5-325 MG PO TABS: 1.0000 | ORAL_TABLET | Freq: Three times a day (TID) | ORAL | 0 refills | Status: AC | PRN

## 2023-05-10 MED ORDER — AMLODIPINE BESYLATE 5 MG PO TABS
5.0000 mg | ORAL_TABLET | Freq: Every day | ORAL | 3 refills | Status: DC
Start: 2023-05-10 — End: 2024-07-10

## 2023-05-10 MED ORDER — IBUPROFEN 600 MG PO TABS
600.0000 mg | ORAL_TABLET | Freq: Three times a day (TID) | ORAL | 0 refills | Status: AC | PRN
Start: 2023-05-10 — End: 2023-05-20

## 2023-05-10 NOTE — Progress Notes (Signed)
I,Jameka J Llittleton, CMA,acting as a Neurosurgeon for [employer], NP.,have documented all relevant documentation on the behalf of Dontarious Schaum, NP,as directed by  Pattiann Solanki Moshe Salisbury, NP while in the presence of Agata Lucente, NP.  Subjective:   Patient ID: Adam Lara , male    DOB: 1980/11/09 , 42 y.o.   MRN: [id number]  Chief Complaint  Patient presents with   Annual Exam    HPI  Patient presents today for a physical. Patient has not had labs in a while, he started Amlodipine 5 mg every day about 2 weeks ago for hypertension, his BP today is well controlled today at 118/82, he states he has been compliant with taking his medication since he was prescribed them at our office  2 weeks ago. Patient reports he still has a bad tooth ache, he was seen at [facility] in [place], [place] on 05/04/2023 where he was started on antibiotics for a dental abscess  and then  he was referred to Ocie Doyne, Oral surgeon for 3rd molar evaluation but unfortunately, he was told they have no opening till November 14th,2024. Patient wants to try  another office.        Past Medical History:  Diagnosis Date   Medical history non-contributory      Family History  Problem Relation Age of Onset   Hypertension Mother    Diabetes Mother    Healthy Sister    Healthy Sister    Healthy Brother    Healthy Brother    Healthy Brother      Current Outpatient Medications:    albuterol (VENTOLIN HFA) 108 (90 Base) MCG/ACT inhaler, Inhale 2 puffs into the lungs every 6 (six) hours as needed for wheezing or shortness of breath., Disp: 8 g, Rfl: 2   HYDROcodone-acetaminophen (NORCO/VICODIN) 5-325 MG tablet, Take 1 tablet by mouth every 8 (eight) hours as needed for up to 5 days for moderate pain or severe pain (may use for severe dental pain)., Disp: 15 tablet, Rfl: 0   ibuprofen (ADVIL) 600 MG tablet, Take 1 tablet (600 mg total) by mouth every 8 (eight) hours as needed for up to 10 days for moderate pain (DO NOT  TAKE ON AN EMPTY STOMACH)., Disp: 30 tablet, Rfl: 0   Vitamin D, Ergocalciferol, (DRISDOL) 1.25 MG (50000 UNIT) CAPS capsule, Take 1 capsule (50,000 Units total) by mouth every 7 (seven) days., Disp: 12 capsule, Rfl: 1   amLODipine (NORVASC) 5 MG tablet, Take 1 tablet (5 mg total) by mouth daily., Disp: 90 tablet, Rfl: 3   No Known Allergies Social History   Substance and Sexual Activity  Alcohol Use Yes   Comment: OCCASIONALLY  . Relevant history for tobacco use is:  Social History   Tobacco Use  Smoking Status Every Day   Current packs/day: 0.50   Average packs/day: 0.5 packs/day for 20.0 years (10.0 ttl pk-yrs)   Types: Cigarettes  Smokeless Tobacco Never  Tobacco Comments   he has cut down to 3-4 cigarettes a day.   .   Review of Systems  Constitutional: Negative.   HENT:  Positive for dental problem.   Eyes: Negative.   Respiratory: Negative.    Cardiovascular: Negative.   Gastrointestinal: Negative.   Endocrine: Negative.   Genitourinary: Negative.   Musculoskeletal: Negative.   Skin: Negative.   Neurological: Negative.   Hematological: Negative.   Psychiatric/Behavioral: Negative.       Today's Vitals   05/10/23 1433  BP: 118/82  Pulse: 84  Temp: 98.4 F (36.9 C)  Weight: 252 lb (114.3 kg)  Height: 5\' 9"  (1.753 m)  PainSc: 0-No pain   Body mass index is 37.21 kg/m.  Wt Readings from Last 3 Encounters:  05/10/23 252 lb (114.3 kg)  04/26/23 253 lb (114.8 kg)  03/04/21 225 lb (102.1 kg)    Objective:  Physical Exam HENT:     Head: Normocephalic.     Nose: Nose normal.     Mouth/Throat:     Dentition: Dental tenderness, dental caries and dental abscesses present.  Cardiovascular:     Rate and Rhythm: Normal rate and regular rhythm.     Pulses: Normal pulses.  Pulmonary:     Effort: Pulmonary effort is normal.     Breath sounds: Normal breath sounds.  Musculoskeletal:        General: Normal range of motion.  Skin:    General: Skin is warm and  dry.  Neurological:     Mental Status: He is alert and oriented to person, place, and time.  Psychiatric:        Mood and Affect: Mood normal.        Behavior: Behavior normal.         Assessment And Plan:    Primary hypertension Assessment & Plan: Controlled . Continue treatment plan, Amlodipine 5mg  every day . Low fat diet  Orders: -     POCT urinalysis dipstick -     Microalbumin / creatinine urine ratio -     EKG 12-Lead -     CMP14+EGFR -     amLODIPine Besylate; Take 1 tablet (5 mg total) by mouth daily.  Dispense: 90 tablet; Refill: 3  Encounter for general adult medical examination w/o abnormal findings -     CBC -     CMP14+EGFR  Pain due to dental caries Assessment & Plan: Refer to another Oral surgeon, take pain meds as needed  Orders: -     HYDROcodone-Acetaminophen; Take 1 tablet by mouth every 8 (eight) hours as needed for up to 5 days for moderate pain or severe pain (may use for severe dental pain).  Dispense: 15 tablet; Refill: 0 -     Ibuprofen; Take 1 tablet (600 mg total) by mouth every 8 (eight) hours as needed for up to 10 days for moderate pain (DO NOT TAKE ON AN EMPTY STOMACH).  Dispense: 30 tablet; Refill: 0  Vitamin D deficiency Assessment & Plan: Check labs  Orders: -     VITAMIN D 25 Hydroxy (Vit-D Deficiency, Fractures)  Abnormal laboratory test Assessment & Plan: Recheck lab  Orders: -     Lipid panel -     Hemoglobin A1c  Class 2 obesity due to excess calories with body mass index (BMI) of 37.0 to 37.9 in adult, unspecified whether serious comorbidity present     Return for 1 year physical. Patient was given opportunity to ask questions. Patient verbalized understanding of the plan and was able to repeat key elements of the plan. All questions were answered to their satisfaction.   Kc Summerson Moshe Salisbury, NP  I, Halsey Hammen Moshe Salisbury, NP, have reviewed all documentation for this visit. The documentation on 05/15/23 for the exam, diagnosis,  procedures, and orders are all accurate and complete.

## 2023-05-11 LAB — CMP14+EGFR
ALT: 29 IU/L (ref 0–44)
AST: 25 IU/L (ref 0–40)
Albumin: 4.5 g/dL (ref 4.1–5.1)
Alkaline Phosphatase: 78 IU/L (ref 44–121)
BUN/Creatinine Ratio: 15 (ref 9–20)
BUN: 13 mg/dL (ref 6–20)
Bilirubin Total: 0.7 mg/dL (ref 0.0–1.2)
CO2: 21 mmol/L (ref 20–29)
Calcium: 9.4 mg/dL (ref 8.7–10.2)
Chloride: 103 mmol/L (ref 96–106)
Creatinine, Ser: 0.87 mg/dL (ref 0.76–1.27)
Globulin, Total: 2.5 g/dL (ref 1.5–4.5)
Glucose: 88 mg/dL (ref 70–99)
Potassium: 4.5 mmol/L (ref 3.5–5.2)
Sodium: 141 mmol/L (ref 134–144)
Total Protein: 7 g/dL (ref 6.0–8.5)
eGFR: 113 mL/min/{1.73_m2} (ref >59)

## 2023-05-11 LAB — CBC
Hematocrit: 39.9 % (ref 37.5–51.0)
Hemoglobin: 12.8 g/dL — ABNORMAL LOW (ref 13.0–17.7)
MCH: 23.7 pg — ABNORMAL LOW (ref 26.6–33.0)
MCHC: 32.1 g/dL (ref 31.5–35.7)
MCV: 74 fL — ABNORMAL LOW (ref 79–97)
Platelets: 227 10*3/uL (ref 150–450)
RBC: 5.39 x10E6/uL (ref 4.14–5.80)
RDW: 14.8 % (ref 11.6–15.4)
WBC: 5.5 10*3/uL (ref 3.4–10.8)

## 2023-05-11 LAB — LIPID PANEL
Chol/HDL Ratio: 5.1 ratio — ABNORMAL HIGH (ref 0.0–5.0)
Cholesterol, Total: 188 mg/dL (ref 100–199)
HDL: 37 mg/dL — ABNORMAL LOW (ref >39)
LDL Chol Calc (NIH): 107 mg/dL — ABNORMAL HIGH (ref 0–99)
Triglycerides: 252 mg/dL — ABNORMAL HIGH (ref 0–149)
VLDL Cholesterol Cal: 44 mg/dL — ABNORMAL HIGH (ref 5–40)

## 2023-05-11 LAB — MICROALBUMIN / CREATININE URINE RATIO
Creatinine, Urine: 424.6 mg/dL
Microalb/Creat Ratio: 5 mg/g{creat} (ref 0–29)
Microalbumin, Urine: 21.4 ug/mL

## 2023-05-11 LAB — HEMOGLOBIN A1C
Est. average glucose Bld gHb Est-mCnc: 126 mg/dL
Hgb A1c MFr Bld: 6 % — ABNORMAL HIGH (ref 4.8–5.6)

## 2023-05-11 LAB — VITAMIN D 25 HYDROXY (VIT D DEFICIENCY, FRACTURES): Vit D, 25-Hydroxy: 24.3 ng/mL — ABNORMAL LOW (ref 30.0–100.0)

## 2023-05-15 DIAGNOSIS — E559 Vitamin D deficiency, unspecified: Secondary | ICD-10-CM | POA: Insufficient documentation

## 2023-05-15 DIAGNOSIS — R899 Unspecified abnormal finding in specimens from other organs, systems and tissues: Secondary | ICD-10-CM | POA: Insufficient documentation

## 2023-05-15 DIAGNOSIS — K029 Dental caries, unspecified: Secondary | ICD-10-CM | POA: Insufficient documentation

## 2023-05-15 NOTE — Assessment & Plan Note (Signed)
Controlled . Continue treatment plan, Amlodipine 5mg  every day . Low fat diet

## 2023-05-15 NOTE — Assessment & Plan Note (Signed)
Refer to another Transport planner, take pain meds as needed

## 2023-05-15 NOTE — Assessment & Plan Note (Signed)
Recheck lab

## 2023-05-15 NOTE — Assessment & Plan Note (Signed)
Check labs 

## 2023-06-29 ENCOUNTER — Other Ambulatory Visit: Payer: Self-pay | Admitting: Family Medicine

## 2023-06-29 DIAGNOSIS — K029 Dental caries, unspecified: Secondary | ICD-10-CM

## 2023-10-07 ENCOUNTER — Other Ambulatory Visit: Payer: Self-pay | Admitting: Family Medicine

## 2023-10-16 ENCOUNTER — Encounter (HOSPITAL_COMMUNITY): Payer: Self-pay

## 2023-10-16 ENCOUNTER — Ambulatory Visit (HOSPITAL_COMMUNITY)
Admission: EM | Admit: 2023-10-16 | Discharge: 2023-10-16 | Disposition: A | Discharge status: Home / Self Care | Payer: Medicaid Other | Attending: Family Medicine | Admitting: Family Medicine

## 2023-10-16 DIAGNOSIS — L0231 Cutaneous abscess of buttock: Secondary | ICD-10-CM

## 2023-10-16 HISTORY — DX: Encounter for other specified aftercare: Z51.89

## 2023-10-16 HISTORY — DX: Anxiety disorder, unspecified: F41.9

## 2023-10-16 HISTORY — DX: Dermatitis, unspecified: L30.9

## 2023-10-16 MED ORDER — SULFAMETHOXAZOLE-TRIMETHOPRIM 800-160 MG PO TABS: 1.0000 | ORAL_TABLET | Freq: Two times a day (BID) | ORAL | 0 refills | Status: DC

## 2023-10-16 MED ORDER — MUPIROCIN 2 % EX OINT: 1.0000 | TOPICAL_OINTMENT | Freq: Two times a day (BID) | CUTANEOUS | 0 refills | Status: AC

## 2023-10-16 NOTE — ED Provider Notes (Signed)
[facility]    CSN: [id number] Arrival date & time: 10/16/23  1121      History   Chief Complaint Chief Complaint  Patient presents with   Abscess    HPI Adam Lara is a 43 y.o. male.    Abscess Patient here with an abscess on the inner left buttocks.  He reports he noticed it approximately 3 days ago.  Reports it has not drained.  Reports he has had these in the past and have required incision and drainage.  He has not had any recent development of abscesses until 3 days ago.  Denies fever.  He has not applied any over-the-counter medication to the affected area.  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 Authorizing Provider  mupirocin ointment (BACTROBAN) 2 % Apply 1 Application topically 2 (two) times daily for 10 days. 10/16/23 10/26/23 Yes Bing Neighbors, NP  sulfamethoxazole-trimethoprim (BACTRIM DS) 800-160 MG tablet Take 1 tablet by  mouth 2 (two) times daily. 10/16/23  Yes Bing Neighbors, NP  albuterol (VENTOLIN HFA) 108 (90 Base) MCG/ACT inhaler Inhale 2 puffs into the lungs every 6 (six) hours as needed for wheezing or shortness of breath. 04/26/23   Ellender Hose, NP  amLODipine (NORVASC) 5 MG tablet Take 1 tablet (5 mg total) by mouth daily. 05/10/23 05/09/24  Ellender Hose, NP  Vitamin D, Ergocalciferol, (DRISDOL) 1.25 MG (50000 UNIT) CAPS capsule TAKE 1 CAPSULE BY MOUTH EVERY 7 DAYS 10/11/23   Ellender Hose, NP    Family History Family History  Problem Relation Age of Onset   Hypertension Mother    Diabetes Mother    Healthy Sister    Healthy Sister    Healthy Brother    Healthy Brother    Healthy Brother     Social History Social History   Tobacco Use   Smoking status: Every Day    Current packs/day: 0.50    Average packs/day: 0.5 packs/day for 20.0 years (10.0 ttl pk-yrs)    Types: Cigarettes   Smokeless tobacco: Never   Tobacco comments:    he has cut down to 3-4 cigarettes a day.   Vaping Use   Vaping status: Never Used  Substance Use Topics   Alcohol use: Yes    Comment: OCCASIONALLY   Drug use: Yes    Types: Marijuana     Allergies   Patient has no known allergies.  Review of Systems Review of Systems Pertinent negatives listed in HPI   Physical Exam Triage Vital Signs ED Triage Vitals [10/16/23 1322]  Encounter Vitals Group     BP 117/77     Systolic BP Percentile      Diastolic BP Percentile      Pulse Rate 97     Resp      Temp 98.1 F (36.7 C)     Temp Source Oral     SpO2 96 %     Weight      Height      Head Circumference      Peak Flow      Pain Score 9     Pain Loc      Pain Education      Exclude from Growth Chart    No data found.  Updated Vital Signs BP 117/77 (BP Location: Left Arm)   Pulse 97   Temp 98.1 F (36.7 C) (Oral)   SpO2 96%   Visual Acuity Right Eye Distance:   Left Eye Distance:   Bilateral Distance:    Right Eye Near:   Left Eye  Near:    Bilateral Near:     Physical Exam Vitals reviewed.  Constitutional:      Appearance: Normal appearance.  Eyes:     Extraocular Movements: Extraocular movements intact.     Pupils: Pupils are equal, round, and reactive to light.  Cardiovascular:     Rate and Rhythm: Normal rate and regular rhythm.  Pulmonary:     Effort: Pulmonary effort is normal.     Breath sounds: Normal breath sounds.  Genitourinary:   Musculoskeletal:     Cervical back: Normal range of motion and neck supple.  Neurological:     General: No focal deficit present.     Mental Status: He is alert and oriented to person, place, and time.      UC Treatments / Results  Labs (all labs ordered are listed, but only abnormal results are displayed) Labs Reviewed - No data to display  EKG   Radiology No results found.  Procedures Procedures (including critical care time)  Medications Ordered in UC Medications - No data to display  Initial Impression / Assessment and Plan / UC Course  I have reviewed the triage vital signs and the nursing notes.  Pertinent labs & imaging results that were available during my care of the patient were reviewed by me and considered in my medical decision making (see chart for details).   Abscess of the left buttocks, I&D not indicated as abscess is actively drainage/treatment with antibiotic therapy prescribed Bactrim double strength twice daily for 10 days and topical Bactroban to be applied twice daily after bathing.  Return precautions given if symptoms do not completely resolve with treatment prescribed. Final Clinical Impressions(s) / UC Diagnoses   Final diagnoses:  Abscess of left buttock     Discharge Instructions      Take medication as prescribed.  As discussed apply topical ointment to area at least twice per day.  Wash with regular soap and water make sure you pat the area dry to promote healing.  Abscess is actively draining therefore do not be  alarmed if you notice bloody discharge draining from the area.     ED Prescriptions     Medication Sig Dispense Auth. Provider   sulfamethoxazole-trimethoprim (BACTRIM DS) 800-160 MG tablet Take 1 tablet by mouth 2 (two) times daily. 20 tablet Bing Neighbors,  NP   mupirocin ointment (BACTROBAN) 2 % Apply 1 Application topically 2 (two) times daily for 10 days. 20 g Bing Neighbors, NP      PDMP not reviewed this encounter.   Bing Neighbors, NP 10/16/23 [phone]

## 2023-10-16 NOTE — ED Triage Notes (Signed)
Patient c/o an abscess to the left buttock x 3 days.

## 2023-10-16 NOTE — ED Notes (Signed)
At bedside for Adam Courts, np evaluation of patient

## 2023-10-16 NOTE — Discharge Instructions (Signed)
Take medication as prescribed.  As discussed apply topical ointment to area at least twice per day.  Wash with regular soap and water make sure you pat the area dry to promote healing.  Abscess is actively draining therefore do not be alarmed if you notice bloody discharge draining from the area.

## 2024-03-25 ENCOUNTER — Other Ambulatory Visit: Payer: Self-pay | Admitting: Family Medicine

## 2024-05-13 ENCOUNTER — Encounter: Payer: Medicaid Other | Admitting: Nurse Practitioner

## 2024-05-13 NOTE — Progress Notes (Deleted)
 LILLETTE Kristeen JINNY Gladis, CMA,acting as a Neurosurgeon for [name], [name].,have documented all relevant documentation on the behalf of [name], [name],as directed by  [name], [name] while in the presence of [name], [name].  Subjective:   Patient ID: Adam Lara , male    DOB: 15-Nov-1980 , 43 y.o.   MRN: [id number]  No chief complaint on file.   HPI  HPI   Past Medical History:  Diagnosis Date   Anxiety    Blood transfusion without reported diagnosis    vitamin D  deiciency   Eczema    Medical history non-contributory      Family History  Problem Relation Age of Onset   Hypertension Mother    Diabetes Mother    Healthy Sister    Healthy Sister    Healthy Brother    Healthy Brother    Healthy Brother      Current Outpatient Medications:    albuterol  (VENTOLIN  HFA) 108 (90 Base) MCG/ACT inhaler, Inhale 2 puffs into the lungs every 6 (six) hours as needed for wheezing or shortness of breath., Disp: 8 g, Rfl: 2   amLODipine  (NORVASC ) 5 MG tablet, Take 1 tablet (5 mg total) by mouth daily., Disp: 90 tablet, Rfl: 3   sulfamethoxazole -trimethoprim  (BACTRIM  DS) 800-160 MG tablet, Take 1 tablet by mouth 2 (two) times daily., Disp: 20 tablet, Rfl: 0   Vitamin D , Ergocalciferol , (DRISDOL ) 1.25 MG (50000 UNIT) CAPS capsule, TAKE 1 CAPSULE BY MOUTH EVERY 7 DAYS, Disp: 12 capsule, Rfl: 1   No Known Allergies   Men's preventive visit. Patient Health Questionnaire (PHQ-2) is  Flowsheet Row Office Visit from 04/26/2023 in [facility]  PHQ-2 Total Score 0  . Patient is on a *** diet. Marital status: Single. Relevant history for alcohol use is:  Social History   Substance and Sexual Activity  Alcohol Use Yes   Comment: OCCASIONALLY  . Relevant history for tobacco use is:  Social History   Tobacco Use  Smoking Status Every Day   Current packs/day: 0.50   Average packs/day: 0.5 packs/day for 20.0 years (10.0 ttl pk-yrs)   Types: Cigarettes   Smokeless Tobacco Never  Tobacco Comments   he has cut down to 3-4 cigarettes a day.   .   Review of Systems   There were no vitals filed for this visit. There is no height or weight on file to calculate BMI.  Wt Readings from Last 3 Encounters:  05/10/23 252 lb (114.3 kg)  04/26/23 253 lb (114.8 kg)  03/04/21 225 lb (102.1 kg)    Objective:  Physical Exam      Assessment And Plan:    Encounter for annual health examination  Primary hypertension  Vitamin D  deficiency     No follow-ups on file. Patient was given opportunity to ask questions. Patient verbalized understanding of the plan and was able to repeat key elements of the plan. All questions were answered to their satisfaction.   [name], [name]  I, [name], [name], have reviewed all documentation for this visit. The documentation on 05/13/24 for the exam, diagnosis, procedures, and orders are all accurate and complete.

## 2024-05-23 ENCOUNTER — Encounter: Payer: Self-pay | Admitting: Nurse Practitioner

## 2024-05-23 NOTE — Progress Notes (Deleted)
 LILLETTE Kristeen JINNY Gladis, CMA,acting as a Neurosurgeon for [name], [name].,have documented all relevant documentation on the behalf of [name], [name],as directed by  [name], [name] while in the presence of [name], [name].  Subjective:   Patient ID: Adam Lara , male    DOB: May 12, 1981 , 43 y.o.   MRN: [id number]  No chief complaint on file.   HPI  HPI   Past Medical History:  Diagnosis Date   Anxiety    Blood transfusion without reported diagnosis    vitamin D  deiciency   Eczema    Medical history non-contributory      Family History  Problem Relation Age of Onset   Hypertension Mother    Diabetes Mother    Healthy Sister    Healthy Sister    Healthy Brother    Healthy Brother    Healthy Brother      Current Outpatient Medications:    albuterol  (VENTOLIN  HFA) 108 (90 Base) MCG/ACT inhaler, Inhale 2 puffs into the lungs every 6 (six) hours as needed for wheezing or shortness of breath., Disp: 8 g, Rfl: 2   amLODipine  (NORVASC ) 5 MG tablet, Take 1 tablet (5 mg total) by mouth daily., Disp: 90 tablet, Rfl: 3   sulfamethoxazole -trimethoprim  (BACTRIM  DS) 800-160 MG tablet, Take 1 tablet by mouth 2 (two) times daily., Disp: 20 tablet, Rfl: 0   Vitamin D , Ergocalciferol , (DRISDOL ) 1.25 MG (50000 UNIT) CAPS capsule, TAKE 1 CAPSULE BY MOUTH EVERY 7 DAYS, Disp: 12 capsule, Rfl: 1   No Known Allergies   Men's preventive visit. Patient Health Questionnaire (PHQ-2) is  Flowsheet Row Office Visit from 04/26/2023 in [facility]  PHQ-2 Total Score 0  . Patient is on a *** diet. Marital status: Single. Relevant history for alcohol use is:  Social History   Substance and Sexual Activity  Alcohol Use Yes   Comment: OCCASIONALLY  . Relevant history for tobacco use is:  Social History   Tobacco Use  Smoking Status Every Day   Current packs/day: 0.50   Average packs/day: 0.5 packs/day for 20.0 years (10.0 ttl pk-yrs)   Types: Cigarettes   Smokeless Tobacco Never  Tobacco Comments   he has cut down to 3-4 cigarettes a day.   .   Review of Systems   There were no vitals filed for this visit. There is no height or weight on file to calculate BMI.  Wt Readings from Last 3 Encounters:  05/10/23 252 lb (114.3 kg)  04/26/23 253 lb (114.8 kg)  03/04/21 225 lb (102.1 kg)    Objective:  Physical Exam      Assessment And Plan:    Encounter for annual health examination  Vitamin D  deficiency     No follow-ups on file. Patient was given opportunity to ask questions. Patient verbalized understanding of the plan and was able to repeat key elements of the plan. All questions were answered to their satisfaction.   [name], [name]  I, [name], [name], have reviewed all documentation for this visit. The documentation on 05/23/24 for the exam, diagnosis, procedures, and orders are all accurate and complete.

## 2024-06-26 ENCOUNTER — Encounter: Admitting: Nurse Practitioner

## 2024-07-10 ENCOUNTER — Other Ambulatory Visit: Payer: Self-pay | Admitting: Nurse Practitioner

## 2024-07-10 DIAGNOSIS — I10 Essential (primary) hypertension: Secondary | ICD-10-CM

## 2024-07-10 NOTE — Telephone Encounter (Unsigned)
 Copied from CRM [id number]. Topic: Clinical - Medication Refill >> Jul 10, 2024 12:31 PM Rachelle R wrote: Medication: amLODipine  (NORVASC ) 5 MG tablet  Has the patient contacted their pharmacy? Yes, call dr  This is the patient's preferred pharmacy:  [id number] - [place], [place] - [address] AT [facility] OF [address] & [name] [facility] [address] Phone: [phone] Fax: [phone]  Is this the correct pharmacy for this prescription? Yes If no, delete pharmacy and type the correct one.   Has the prescription been filled recently? No  Is the patient out of the medication? No  Has the patient been seen for an appointment in the last year OR does the patient have an upcoming appointment? No  Can we respond through MyChart? Yes  Agent: Please be advised that Rx refills may take up to 3 business days. We ask that you follow-up with your pharmacy.

## 2024-07-11 MED ORDER — AMLODIPINE BESYLATE 5 MG PO TABS: 5.0000 mg | ORAL_TABLET | Freq: Every day | ORAL | 0 refills | Status: DC

## 2024-08-12 ENCOUNTER — Telehealth: Payer: Self-pay

## 2024-08-12 ENCOUNTER — Ambulatory Visit (INDEPENDENT_AMBULATORY_CARE_PROVIDER_SITE_OTHER): Payer: Self-pay | Admitting: Nurse Practitioner

## 2024-08-12 VITALS — BP 120/60 | HR 85 | Temp 98.5°F | Ht 69.0 in | Wt 268.6 lb

## 2024-08-12 DIAGNOSIS — Z6839 Body mass index (BMI) 39.0-39.9, adult: Secondary | ICD-10-CM

## 2024-08-12 DIAGNOSIS — E66812 Obesity, class 2: Secondary | ICD-10-CM

## 2024-08-12 DIAGNOSIS — Z136 Encounter for screening for cardiovascular disorders: Secondary | ICD-10-CM

## 2024-08-12 DIAGNOSIS — I10 Essential (primary) hypertension: Secondary | ICD-10-CM

## 2024-08-12 DIAGNOSIS — E559 Vitamin D deficiency, unspecified: Secondary | ICD-10-CM

## 2024-08-12 DIAGNOSIS — M79671 Pain in right foot: Secondary | ICD-10-CM | POA: Insufficient documentation

## 2024-08-12 DIAGNOSIS — R002 Palpitations: Secondary | ICD-10-CM

## 2024-08-12 DIAGNOSIS — Z13220 Encounter for screening for lipoid disorders: Secondary | ICD-10-CM

## 2024-08-12 DIAGNOSIS — Z139 Encounter for screening, unspecified: Secondary | ICD-10-CM

## 2024-08-12 DIAGNOSIS — Z2821 Immunization not carried out because of patient refusal: Secondary | ICD-10-CM

## 2024-08-12 MED ORDER — AMLODIPINE BESYLATE 10 MG PO TABS: 10.0000 mg | ORAL_TABLET | Freq: Every day | ORAL | 1 refills | Status: AC

## 2024-08-12 MED ORDER — AMLODIPINE BESYLATE 5 MG PO TABS: 5.0000 mg | ORAL_TABLET | Freq: Every day | ORAL | 1 refills | Status: DC

## 2024-08-12 NOTE — Patient Instructions (Signed)
  VISIT SUMMARY: Today, you came in for a blood pressure check and discussed several health concerns, including dizziness, heart palpitations, and right heel pain. We reviewed your current medications and lifestyle habits, and made some adjustments to your treatment plan.  YOUR PLAN: -ESSENTIAL HYPERTENSION: Hypertension, or high blood pressure, is when the force of the blood against your artery walls is too high. We have increased your amlodipine  dose to 10 mg daily and advised you to get a blood pressure cuff to monitor your blood pressure at home. Please check your blood pressure daily and let us  know if it consistently falls below 100/60 mmHg.  -RIGHT HEEL PAIN, POSSIBLE PLANTAR FASCIITIS OR HEEL SPUR: Plantar fasciitis or a heel spur can cause severe heel pain, especially in the morning. We have referred you to a foot specialist for further evaluation and possible x-rays. In the meantime, you can use an over-the-counter antifungal spray if you suspect athlete's foot.  -PALPITATIONS: Palpitations are feelings of having a fast-beating, fluttering, or pounding heart. We have ordered blood tests to check your cholesterol, thyroid function, and iron levels to determine if these could be contributing to your symptoms.  -OBESITY, CLASS 2: Class 2 obesity means having a body mass index (BMI) of 35-39.9. We discussed your dietary habits and the importance of maintaining a healthy diet and regular exercise.  INSTRUCTIONS: Please follow up with the foot specialist as soon as possible for your heel pain. Start monitoring your blood pressure at home daily and report any readings consistently below 100/60 mmHg. We will contact you with the results of your blood tests once they are available.                      Contains text generated by [employer]bridge.                                 Contains text generated by [employer]bridge.

## 2024-08-12 NOTE — Assessment & Plan Note (Addendum)
 Will check vitamin D  level and supplement as needed.    Also encouraged to spend 15 minutes in the sun daily.

## 2024-08-12 NOTE — Progress Notes (Signed)
 LILLETTE Kristeen JINNY Gladis, CMA,acting as a neurosurgeon for Adam Ada, [name].,have documented all relevant documentation on the behalf of Adam Ada, [name],as directed by  Adam Ada, [name] while in the presence of Adam Ada, [name].  Subjective:  Patient ID: Adam Lara , male    DOB: 1981-08-03 , 43 y.o.   MRN: [id number]  Chief Complaint  Patient presents with   Hypertension    Patient presents today for a bp follow up, Patient reports compliance with medication. Patient denies any chest pain, SOB, or headaches. Patient reports about 2/3 times a week he has to take a second BP pill in the evening due to Palpitations and head tension.     HPI  Discussed the use of AI scribe software for clinical note transcription with the patient, who gave verbal consent to proceed.  History of Present Illness Adam Lara is a 43 year old male with hypertension who presents for a blood pressure check.  He experiences dizziness and 'head tensions' which he associates with his blood pressure. He takes amlodipine  5 mg once daily but sometimes doubles the dose when symptomatic. He does not monitor his blood pressure at home due to lack of a cuff. Symptoms occur two to three times a week, with the last episode yesterday.  He describes heart palpitations as 'heart fluttering.' He has not consulted a cardiologist and has no known family history of heart problems. He drinks about a gallon to a gallon and a half of water daily and exercises twice a week.  He has significant right heel pain for the past six months, described as severe, especially in the mornings. The pain lessens when off work but worsens after standing for long periods. He has not seen a foot specialist. Swelling in his right foot and ankle has been severe for the last six months, sometimes requiring elevation.  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 He avoids  fatty, fried, or salty foods. He was born with low iron levels, managed by his mother with dietary measures like liver, and he tries to maintain his iron levels as an adult.  Past Medical History:  Diagnosis Date   Anxiety    Blood transfusion without reported diagnosis    vitamin D  deiciency   Eczema    Medical history non-contributory      Family History  Problem Relation Age of Onset   Hypertension Mother    Diabetes Mother    Healthy Sister    Healthy Sister    Healthy Brother    Healthy Brother    Healthy Brother      Current Outpatient Medications:    albuterol  (VENTOLIN  HFA) 108 (90 Base) MCG/ACT inhaler, Inhale 2 puffs into the lungs every 6 (six) hours as needed for wheezing or shortness of breath., Disp: 8 g, Rfl: 2   Vitamin D , Ergocalciferol , (DRISDOL ) 1.25 MG (50000 UNIT) CAPS capsule, TAKE 1 CAPSULE BY MOUTH EVERY 7 DAYS, Disp: 12 capsule, Rfl: 1   amLODipine  (NORVASC ) 10 MG tablet, Take 1 tablet (10 mg total) by mouth daily., Disp: 90 tablet, Rfl: 1   No Known Allergies   Review of Systems  Constitutional: Negative.   Respiratory: Negative.    Cardiovascular: Negative.   Neurological: Negative.   Psychiatric/Behavioral: Negative.       Today's Vitals   08/12/24 0911  BP: 120/60  Pulse: 85  Temp: 98.5 F (36.9 C)  TempSrc: Oral  Weight: 268 lb 9.6 oz (121.8 kg)  Height: 5' 9 (1.753 m)  PainSc: 0-No pain   Body mass index is 39.67 kg/m.  Wt Readings from Last 3 Encounters:  08/12/24 268 lb 9.6 oz (121.8 kg)  05/10/23 252 lb (114.3 kg)  04/26/23 253 lb (114.8 kg)     Objective:  Physical Exam Vitals and nursing note reviewed.  Constitutional:      General: He is not in acute distress.    Appearance: Normal appearance. He is obese.  HENT:     Head: Normocephalic.  Cardiovascular:     Rate and Rhythm: Normal rate and regular rhythm.     Pulses: Normal pulses.     Heart sounds: Normal heart sounds. No murmur heard. Pulmonary:     Effort:  Pulmonary effort is normal. No respiratory distress.     Breath sounds: Normal breath sounds. No wheezing.  Musculoskeletal:        General: Tenderness (right heel pain on palpation) present. Normal range of motion.  Skin:    General: Skin is warm and dry.     Capillary Refill: Capillary refill takes less than 2 seconds.     Comments: Dry scaly skin to bilateral feet the right is worse than left.   Neurological:     General: No focal deficit present.     Mental Status: He is alert and oriented to person, place, and time.     Cranial Nerves: No cranial nerve deficit.     Motor: No weakness.  Psychiatric:        Mood and Affect: Mood normal.        Behavior: Behavior normal.        Thought Content: Thought content normal.        Judgment: Judgment normal.         Assessment And Plan:   Assessment & Plan Primary hypertension Hypertension managed with amlodipine  5 mg. Reports dizziness and palpitations, leading to self-adjustment of medication. No home blood pressure monitoring currently in place. - Increased amlodipine  to 10 mg daily. - Advised obtaining a blood pressure cuff for home monitoring. - Instructed to check blood pressure daily and report if consistently below 100/60 mmHg. Vitamin D  deficiency Will check vitamin D  level and supplement as needed.    Also encouraged to spend 15 minutes in the sun daily.   Palpitations Intermittent palpitations with no prior cardiology evaluation. Possible iron deficiency considered as a contributing factor. - Ordered blood tests to check cholesterol, thyroid function, and iron levels. - EKG done with NSR HR 68 Influenza vaccination declined  Class 2 obesity without serious comorbidity with body mass index (BMI) of 39.0 to 39.9 in adult, unspecified obesity type Obesity class 2 with dietary habits influenced by work as an retail buyer. Encounter for screening  Encounter for lipid screening for cardiovascular disease  Pain of right  heel Chronic right heel pain for six months, severe in the morning, suggestive of plantar fasciitis or heel spur. No prior evaluation by a foot specialist. - Referred to a foot specialist for evaluation and possible x-rays. - he has scaly skin as well to his feet with right worse than left. Recommended over-the-counter antifungal spray for possible athlete's foot.  Orders Placed This Encounter  Procedures   BMP8+eGFR   VITAMIN D  25 Hydroxy (Vit-D Deficiency, Fractures)   Hepatitis B Surface Antibody   TSH   Lipid panel   Iron, TIBC and Ferritin Panel   CBC with  Diff   Ambulatory referral to Podiatry   EKG 12-Lead     Return in about 4 months (around 12/10/2024) for PHY WHEN ABLE.  Patient was given opportunity to ask questions. Patient verbalized understanding of the plan and was able to repeat key elements of the plan. All questions were answered to their satisfaction.    LILLETTE Adam Ada, [name], have reviewed all documentation for this visit. The documentation on 08/12/24 for the exam, diagnosis, procedures, and orders are all accurate and complete.   IF YOU HAVE BEEN REFERRED TO A SPECIALIST, IT MAY TAKE 1-2 WEEKS TO SCHEDULE/PROCESS THE REFERRAL. IF YOU HAVE NOT HEARD FROM US /SPECIALIST IN TWO WEEKS, PLEASE GIVE US  A CALL AT [phone].

## 2024-08-12 NOTE — Assessment & Plan Note (Signed)
 Hypertension managed with amlodipine  5 mg. Reports dizziness and palpitations, leading to self-adjustment of medication. No home blood pressure monitoring currently in place. - Increased amlodipine  to 10 mg daily. - Advised obtaining a blood pressure cuff for home monitoring. - Instructed to check blood pressure daily and report if consistently below 100/60 mmHg.

## 2024-08-12 NOTE — Assessment & Plan Note (Signed)
 Chronic right heel pain for six months, severe in the morning, suggestive of plantar fasciitis or heel spur. No prior evaluation by a foot specialist. - Referred to a foot specialist for evaluation and possible x-rays. - he has scaly skin as well to his feet with right worse than left. Recommended over-the-counter antifungal spray for possible athlete's foot.

## 2024-08-12 NOTE — Telephone Encounter (Signed)
 Copied from CRM #[id number]. Topic: Clinical - Medication Question >> Aug 12, 2024  4:33 PM Lauren C wrote: Reason for CRM: Patient just picked up a 90 day supply of the 5 mg amlodipine  today and then was increased to 10 mg at his appointment today. He wants to know if he can just take 2 pills of the 5mg  until he runs out and then he can fill the 10mg . Requesting call back at [phone]

## 2024-08-13 LAB — CBC WITH DIFFERENTIAL/PLATELET
Basophils Absolute: 0 x10E3/uL (ref 0.0–0.2)
Basos: 1 %
EOS (ABSOLUTE): 0.1 x10E3/uL (ref 0.0–0.4)
Eos: 3 %
Hematocrit: 41.6 % (ref 37.5–51.0)
Hemoglobin: 12.8 g/dL — ABNORMAL LOW (ref 13.0–17.7)
Immature Grans (Abs): 0 x10E3/uL (ref 0.0–0.1)
Immature Granulocytes: 0 %
Lymphocytes Absolute: 2.3 x10E3/uL (ref 0.7–3.1)
Lymphs: 53 %
MCH: 22.9 pg — ABNORMAL LOW (ref 26.6–33.0)
MCHC: 30.8 g/dL — ABNORMAL LOW (ref 31.5–35.7)
MCV: 74 fL — ABNORMAL LOW (ref 79–97)
Monocytes Absolute: 0.4 x10E3/uL (ref 0.1–0.9)
Monocytes: 9 %
Neutrophils Absolute: 1.5 x10E3/uL (ref 1.4–7.0)
Neutrophils: 34 %
Platelets: 206 x10E3/uL (ref 150–450)
RBC: 5.6 x10E6/uL (ref 4.14–5.80)
RDW: 15.4 % (ref 11.6–15.4)
WBC: 4.3 x10E3/uL (ref 3.4–10.8)

## 2024-08-13 LAB — VITAMIN D 25 HYDROXY (VIT D DEFICIENCY, FRACTURES): Vit D, 25-Hydroxy: 32.8 ng/mL (ref 30.0–100.0)

## 2024-08-13 LAB — LIPID PANEL
Chol/HDL Ratio: 3.8 ratio (ref 0.0–5.0)
Cholesterol, Total: 152 mg/dL (ref 100–199)
HDL: 40 mg/dL (ref >39)
LDL Chol Calc (NIH): 96 mg/dL (ref 0–99)
Triglycerides: 82 mg/dL (ref 0–149)
VLDL Cholesterol Cal: 16 mg/dL (ref 5–40)

## 2024-08-13 LAB — IRON,TIBC AND FERRITIN PANEL
Ferritin: 196 ng/mL (ref 30–400)
Iron Saturation: 19 % (ref 15–55)
Iron: 70 ug/dL (ref 38–169)
Total Iron Binding Capacity: 363 ug/dL (ref 250–450)
UIBC: 293 ug/dL (ref 111–343)

## 2024-08-13 LAB — BMP8+EGFR
BUN/Creatinine Ratio: 12 (ref 9–20)
BUN: 11 mg/dL (ref 6–24)
CO2: 23 mmol/L (ref 20–29)
Calcium: 9.6 mg/dL (ref 8.7–10.2)
Chloride: 105 mmol/L (ref 96–106)
Creatinine, Ser: 0.9 mg/dL (ref 0.76–1.27)
Glucose: 95 mg/dL (ref 70–99)
Potassium: 4.4 mmol/L (ref 3.5–5.2)
Sodium: 138 mmol/L (ref 134–144)
eGFR: 110 mL/min/1.73 (ref >59)

## 2024-08-13 LAB — TSH: TSH: 1.22 u[IU]/mL (ref 0.450–4.500)

## 2024-08-13 LAB — HEPATITIS B SURFACE ANTIBODY,QUALITATIVE: Hep B Surface Ab, Qual: REACTIVE

## 2024-08-19 ENCOUNTER — Ambulatory Visit: Payer: Self-pay | Admitting: Podiatry

## 2024-08-19 DIAGNOSIS — Z91199 Patient's noncompliance with other medical treatment and regimen due to unspecified reason: Secondary | ICD-10-CM

## 2024-08-19 NOTE — Progress Notes (Signed)
 Cancel 24 hours

## 2024-09-02 ENCOUNTER — Ambulatory Visit: Payer: Self-pay | Admitting: Podiatry

## 2024-12-12 ENCOUNTER — Encounter: Payer: Self-pay | Admitting: Nurse Practitioner
